# Patient Record
Sex: Male | Born: 1940 | Race: White | Hispanic: No | Marital: Married | Smoking: Former smoker
Health system: Southern US, Community
[De-identification: ages and names within clinical notes are randomized; demographics above are authoritative.]

## PROBLEM LIST (undated history)

## (undated) DIAGNOSIS — G709 Myoneural disorder, unspecified: Secondary | ICD-10-CM

## (undated) DIAGNOSIS — Z87442 Personal history of urinary calculi: Secondary | ICD-10-CM

## (undated) DIAGNOSIS — G4730 Sleep apnea, unspecified: Secondary | ICD-10-CM

## (undated) DIAGNOSIS — F32A Depression, unspecified: Secondary | ICD-10-CM

## (undated) DIAGNOSIS — M109 Gout, unspecified: Secondary | ICD-10-CM

## (undated) DIAGNOSIS — I10 Essential (primary) hypertension: Secondary | ICD-10-CM

## (undated) DIAGNOSIS — M1990 Unspecified osteoarthritis, unspecified site: Secondary | ICD-10-CM

## (undated) DIAGNOSIS — F419 Anxiety disorder, unspecified: Secondary | ICD-10-CM

## (undated) DIAGNOSIS — F329 Major depressive disorder, single episode, unspecified: Secondary | ICD-10-CM

## (undated) DIAGNOSIS — K219 Gastro-esophageal reflux disease without esophagitis: Secondary | ICD-10-CM

## (undated) DIAGNOSIS — C801 Malignant (primary) neoplasm, unspecified: Secondary | ICD-10-CM

## (undated) HISTORY — PX: OTHER SURGICAL HISTORY: SHX169

## (undated) HISTORY — PX: EYE SURGERY: SHX253

## (undated) HISTORY — PX: HIP ARTHROPLASTY: SHX981

## (undated) HISTORY — PX: HAMMER TOE SURGERY: SHX385

## (undated) HISTORY — PX: JOINT REPLACEMENT: SHX530

## (undated) HISTORY — PX: TRANSURETHRAL RESECTION OF PROSTATE: SHX73

## (undated) HISTORY — PX: HAND ARTHROTOMY: SHX969

## (undated) HISTORY — PX: CHOLECYSTECTOMY: SHX55

## (undated) HISTORY — PX: APPENDECTOMY: SHX54

---

## 1997-11-17 ENCOUNTER — Ambulatory Visit (HOSPITAL_COMMUNITY): Admission: RE | Admit: 1997-11-17 | Discharge: 1997-11-17 | Payer: Self-pay | Admitting: Internal Medicine

## 1997-12-07 ENCOUNTER — Ambulatory Visit (HOSPITAL_BASED_OUTPATIENT_CLINIC_OR_DEPARTMENT_OTHER): Admission: RE | Admit: 1997-12-07 | Discharge: 1997-12-07 | Payer: Self-pay | Admitting: Orthopedic Surgery

## 1997-12-20 ENCOUNTER — Other Ambulatory Visit: Admission: RE | Admit: 1997-12-20 | Discharge: 1997-12-20 | Payer: Self-pay | Admitting: Urology

## 1998-05-04 ENCOUNTER — Ambulatory Visit: Admission: RE | Admit: 1998-05-04 | Discharge: 1998-05-04 | Payer: Self-pay | Admitting: Otolaryngology

## 1998-05-11 ENCOUNTER — Ambulatory Visit (HOSPITAL_BASED_OUTPATIENT_CLINIC_OR_DEPARTMENT_OTHER): Admission: RE | Admit: 1998-05-11 | Discharge: 1998-05-11 | Payer: Self-pay | Admitting: Orthopedic Surgery

## 1998-06-04 HISTORY — PX: KNEE ARTHROPLASTY: SHX992

## 1998-09-26 ENCOUNTER — Ambulatory Visit (HOSPITAL_BASED_OUTPATIENT_CLINIC_OR_DEPARTMENT_OTHER): Admission: RE | Admit: 1998-09-26 | Discharge: 1998-09-26 | Payer: Self-pay | Admitting: Orthopedic Surgery

## 1998-12-09 ENCOUNTER — Encounter: Payer: Self-pay | Admitting: Orthopedic Surgery

## 1998-12-14 ENCOUNTER — Inpatient Hospital Stay (HOSPITAL_COMMUNITY): Admission: RE | Admit: 1998-12-14 | Discharge: 1998-12-19 | Payer: Self-pay | Admitting: Orthopedic Surgery

## 1998-12-19 ENCOUNTER — Inpatient Hospital Stay (HOSPITAL_COMMUNITY)
Admission: RE | Admit: 1998-12-19 | Discharge: 1998-12-24 | Payer: Self-pay | Admitting: Physical Medicine & Rehabilitation

## 2000-06-04 DIAGNOSIS — C801 Malignant (primary) neoplasm, unspecified: Secondary | ICD-10-CM

## 2000-06-04 HISTORY — DX: Malignant (primary) neoplasm, unspecified: C80.1

## 2001-06-04 HISTORY — PX: CATARACT EXTRACTION: SUR2

## 2001-10-31 ENCOUNTER — Encounter: Payer: Self-pay | Admitting: Internal Medicine

## 2001-10-31 ENCOUNTER — Encounter: Admission: RE | Admit: 2001-10-31 | Discharge: 2001-10-31 | Payer: Self-pay | Admitting: Internal Medicine

## 2002-01-18 ENCOUNTER — Encounter: Payer: Self-pay | Admitting: Emergency Medicine

## 2002-01-18 ENCOUNTER — Emergency Department (HOSPITAL_COMMUNITY): Admission: EM | Admit: 2002-01-18 | Discharge: 2002-01-18 | Payer: Self-pay | Admitting: Emergency Medicine

## 2002-01-29 ENCOUNTER — Ambulatory Visit (HOSPITAL_BASED_OUTPATIENT_CLINIC_OR_DEPARTMENT_OTHER): Admission: RE | Admit: 2002-01-29 | Discharge: 2002-01-29 | Payer: Self-pay | Admitting: Urology

## 2002-01-29 ENCOUNTER — Encounter: Payer: Self-pay | Admitting: Urology

## 2002-02-17 ENCOUNTER — Encounter: Admission: RE | Admit: 2002-02-17 | Discharge: 2002-02-17 | Payer: Self-pay | Admitting: Urology

## 2002-02-17 ENCOUNTER — Encounter: Payer: Self-pay | Admitting: Urology

## 2005-01-04 ENCOUNTER — Ambulatory Visit (HOSPITAL_COMMUNITY): Admission: RE | Admit: 2005-01-04 | Discharge: 2005-01-04 | Payer: Self-pay | Admitting: Internal Medicine

## 2005-01-11 ENCOUNTER — Ambulatory Visit (HOSPITAL_COMMUNITY): Admission: RE | Admit: 2005-01-11 | Discharge: 2005-01-11 | Payer: Self-pay | Admitting: Internal Medicine

## 2005-02-12 ENCOUNTER — Ambulatory Visit (HOSPITAL_COMMUNITY): Admission: RE | Admit: 2005-02-12 | Discharge: 2005-02-12 | Payer: Self-pay | Admitting: *Deleted

## 2005-02-12 ENCOUNTER — Encounter (INDEPENDENT_AMBULATORY_CARE_PROVIDER_SITE_OTHER): Payer: Self-pay | Admitting: Specialist

## 2005-02-14 ENCOUNTER — Ambulatory Visit (HOSPITAL_COMMUNITY): Admission: RE | Admit: 2005-02-14 | Discharge: 2005-02-17 | Payer: Self-pay | Admitting: General Surgery

## 2005-02-14 ENCOUNTER — Encounter (INDEPENDENT_AMBULATORY_CARE_PROVIDER_SITE_OTHER): Payer: Self-pay | Admitting: Specialist

## 2007-03-19 ENCOUNTER — Ambulatory Visit (HOSPITAL_COMMUNITY): Admission: RE | Admit: 2007-03-19 | Discharge: 2007-03-19 | Payer: Self-pay | Admitting: *Deleted

## 2008-03-23 ENCOUNTER — Emergency Department (HOSPITAL_COMMUNITY): Admission: EM | Admit: 2008-03-23 | Discharge: 2008-03-23 | Payer: Self-pay | Admitting: Emergency Medicine

## 2008-08-16 ENCOUNTER — Encounter: Admission: RE | Admit: 2008-08-16 | Discharge: 2008-08-16 | Payer: Self-pay | Admitting: Internal Medicine

## 2010-05-22 ENCOUNTER — Inpatient Hospital Stay (HOSPITAL_COMMUNITY)
Admission: RE | Admit: 2010-05-22 | Discharge: 2010-05-25 | Payer: Self-pay | Source: Home / Self Care | Attending: Orthopedic Surgery | Admitting: Orthopedic Surgery

## 2010-08-14 LAB — URINALYSIS, ROUTINE W REFLEX MICROSCOPIC
Glucose, UA: NEGATIVE mg/dL
Specific Gravity, Urine: 1.021 (ref 1.005–1.030)
pH: 5 (ref 5.0–8.0)

## 2010-08-14 LAB — CBC
HCT: 31.6 % — ABNORMAL LOW (ref 39.0–52.0)
MCH: 30.5 pg (ref 26.0–34.0)
MCHC: 33.5 g/dL (ref 30.0–36.0)
MCHC: 33.8 g/dL (ref 30.0–36.0)
MCHC: 33.8 g/dL (ref 30.0–36.0)
MCV: 90.5 fL (ref 78.0–100.0)
MCV: 90.6 fL (ref 78.0–100.0)
MCV: 90.8 fL (ref 78.0–100.0)
Platelets: 112 10*3/uL — ABNORMAL LOW (ref 150–400)
Platelets: 122 10*3/uL — ABNORMAL LOW (ref 150–400)
Platelets: 146 10*3/uL — ABNORMAL LOW (ref 150–400)
RDW: 12.5 % (ref 11.5–15.5)
RDW: 12.6 % (ref 11.5–15.5)
RDW: 12.8 % (ref 11.5–15.5)
WBC: 14.8 10*3/uL — ABNORMAL HIGH (ref 4.0–10.5)
WBC: 8.1 10*3/uL (ref 4.0–10.5)
WBC: 9.3 10*3/uL (ref 4.0–10.5)

## 2010-08-14 LAB — BASIC METABOLIC PANEL
BUN: 15 mg/dL (ref 6–23)
Creatinine, Ser: 1.03 mg/dL (ref 0.4–1.5)
GFR calc non Af Amer: >60 mL/min (ref >60)
Glucose, Bld: 156 mg/dL — ABNORMAL HIGH (ref 70–99)

## 2010-08-14 LAB — PROTIME-INR: INR: 1.03 (ref 0.00–1.49)

## 2010-08-14 LAB — URINE MICROSCOPIC-ADD ON

## 2010-08-15 LAB — BASIC METABOLIC PANEL
BUN: 29 mg/dL — ABNORMAL HIGH (ref 6–23)
CO2: 25 mEq/L (ref 19–32)
Calcium: 9.9 mg/dL (ref 8.4–10.5)
Chloride: 101 mEq/L (ref 96–112)
Creatinine, Ser: 1.21 mg/dL (ref 0.4–1.5)
GFR calc Af Amer: >60 mL/min (ref >60)
Glucose, Bld: 93 mg/dL (ref 70–99)

## 2010-08-15 LAB — CBC
MCH: 31.4 pg (ref 26.0–34.0)
MCHC: 34.9 g/dL (ref 30.0–36.0)
MCV: 89.9 fL (ref 78.0–100.0)
Platelets: 137 10*3/uL — ABNORMAL LOW (ref 150–400)
RDW: 12.6 % (ref 11.5–15.5)

## 2010-08-15 LAB — PROTIME-INR: Prothrombin Time: 12.9 seconds (ref 11.6–15.2)

## 2010-08-15 LAB — ABO/RH: ABO/RH(D): A POS

## 2010-08-15 LAB — TYPE AND SCREEN

## 2010-10-17 NOTE — Op Note (Signed)
NAME:  GER, RINGENBERG NO.:  [id number]   MEDICAL RECORD NO.:  [id number]          PATIENT TYPE:  AMB   LOCATION:  ENDO                         FACILITY:  [facility]   PHYSICIAN:  Georgiana Spinner, M.D.    DATE OF BIRTH:  [birth year]   DATE OF PROCEDURE:  DATE OF DISCHARGE:                               OPERATIVE REPORT   PROCEDURE:  Colonoscopy.   INDICATIONS:  Colon polyps.   ANESTHESIA:  Fentanyl 100 mcg, Versed 10 mg.   PROCEDURE IN DETAIL:  With the patient mildly sedated in the left  lateral decubitus position, a rectal exam was performed which was  unremarkable on examination.  Subsequently, the Pentax videoscopic  colonoscope was inserted into the rectum and passed under direct vision  through a diverticula-filled sigmoid colon to reach the cecum,  identified by ileocecal valve and appendiceal orifice, both of which  were photographed.  From this point, colonoscope was slowly withdrawn  taking circumferential views of colonic mucosa stopping only then in the  rectum which appeared normal and showed hemorrhoidal tissue on  retroflexed view.  The endoscope was straightened and withdrawn.  The  patient's vital signs and pulse oximeter remained stable.  The patient  tolerated the procedure well and left without apparent complication.   FINDINGS:  1. Diverticulosis of sigmoid colon.  2. Internal hemorrhoids, otherwise unremarkable exam.   PLAN:  Repeat examination of 5 years.           ______________________________  Georgiana Spinner, M.D.     GMO/MEDQ  D:  03/19/2007  T:  03/19/2007  Job:  [id number]

## 2010-10-20 NOTE — Op Note (Signed)
NAME:  Austin Webb, Austin Webb               ACCOUNT NO.:  [id number]   MEDICAL RECORD NO.:  [id number]          PATIENT TYPE:  AMB   LOCATION:  ENDO                         FACILITY:  [facility]   PHYSICIAN:  Georgiana Spinner, M.D.    DATE OF BIRTH:  08/07/1940   DATE OF PROCEDURE:  02/12/2005  DATE OF DISCHARGE:                                 OPERATIVE REPORT   PROCEDURE:  Upper endoscopy.   INDICATIONS FOR PROCEDURE:  Gastroesophageal reflux disease symptoms.   ANESTHESIA:  Demerol 50, Versed 5 mg.   DESCRIPTION OF PROCEDURE:  With the patient mildly sedated in the left  lateral decubitus position, the Olympus videoscopic endoscope was inserted  in the mouth and passed under direct vision through the esophagus which  appeared normal into the stomach. The fundus, body, antrum, duodenal bulb  and second portion of the duodenum were visualized. From this point, the  endoscope was slowly withdrawn taking circumferential views of the duodenal  mucosa until the endoscope had been pulled back into the stomach, placed in  retroflexion to view the stomach from below. The endoscope was straightened  and withdrawn taking circumferential views of the remaining gastric and  esophageal mucosa stopping in the fundus of the stomach where some  erythematous changes were seen on the gastric folds. They were photographed  and biopsied. The patient's vital signs and pulse oximeter remained stable.  The patient tolerated the procedure well without apparent complications.   FINDINGS:  Minimal erythema of gastric folds biopsied, await biopsy report.  The patient will call me for results and followup with me as an outpatient.  Proceed to colonoscopy as planned.           ______________________________  Georgiana Spinner, M.D.     GMO/MEDQ  D:  02/12/2005  T:  02/12/2005  Job:  [id number]   cc:   Anselm Pancoast. Zachery Dakins, M.D.  [address]  [place]  [place] [address]

## 2010-10-20 NOTE — Op Note (Signed)
NAME:  Austin Webb, Austin Webb               ACCOUNT NO.:  [id number]   MEDICAL RECORD NO.:  [id number]          PATIENT TYPE:  AMB   LOCATION:  ENDO                         FACILITY:  [facility]   PHYSICIAN:  Georgiana Spinner, M.D.    DATE OF BIRTH:  1941/04/11   DATE OF PROCEDURE:  02/12/2005  DATE OF DISCHARGE:                                 OPERATIVE REPORT   PROCEDURE:  Colonoscopy.   INDICATIONS FOR PROCEDURE:  Colon polyps.   ANESTHESIA:  Demerol 10, Versed 3 mg.   DESCRIPTION OF PROCEDURE:  With the patient mildly sedated in the left  lateral decubitus position, a rectal exam was performed which was  unremarkable. Subsequently, the Olympus videoscopic colonoscope was inserted  into the rectum and passed under direct vision through a diverticula filled  sigmoid colon to the cecum identified by the ileocecal valve and appendiceal  orifice both of which were photographed. From this point, the colonoscope  was slowly withdrawn taking circumferential views of the entire colonic  mucosa stopping first a few folds removed from the ileocecal valve where a  polyp was seen, photographed and removed using snare cautery technique on a  setting of 20/200 blended current. There was some residual tissue that was  touched with the hot biopsy forceps technique. We next stopped in the  hepatic flexure area where another polyp was seen and it too was removed  using hot biopsy forceps technique with the same setting. A third polyp was  seen in the sigmoid colon at approximately 20 cm from the anal verge and it  too was removed using hot biopsy forceps technique with the same setting.  The endoscope was then pulled to the rectum which appeared normal on direct  and showed small probably hyperplastic polyps in the retroflexed view and  these were eradicated with the hot biopsy forceps. The endoscope was  straightened and withdrawn, the patient's vital signs and pulse oximeter  remained stable. The patient  tolerated the procedure well without apparent  complications.   FINDINGS:  Polyps of rectum, sigmoid colon, hepatic flexure and ascending  colon all removed. Await biopsy reports. The patient will call me for  results and followup with me in as an outpatient. Additionally, fairly  significant diverticulosis of sigmoid colon.   PLAN:  See above note and endoscopy not for further evaluation.           ______________________________  Georgiana Spinner, M.D.     GMO/MEDQ  D:  02/12/2005  T:  02/12/2005  Job:  [id number]   cc:   Anselm Pancoast. Zachery Dakins, M.D.  [address]  [place]  [place] [address]

## 2010-10-20 NOTE — Op Note (Signed)
NAME:  Austin Webb, Austin Webb               ACCOUNT NO.:  [id number]   MEDICAL RECORD NO.:  [id number]          PATIENT TYPE:  OIB   LOCATION:  1621                         FACILITY:  [facility]   PHYSICIAN:  Anselm Pancoast. Weatherly, M.D.DATE OF BIRTH:  1941-04-03   DATE OF PROCEDURE:  02/14/2005  DATE OF DISCHARGE:                                 OPERATIVE REPORT   PREOPERATIVE DIAGNOSIS:  Chronic cholecystitis with stones.   POSTOPERATIVE DIAGNOSIS:  Chronic cholecystitis with stones and common duct  stones.   OPERATION:  Laparoscopic cholecystectomy with cholangiogram.   ANESTHESIA:  General.   SURGEON:  Anselm Pancoast. Zachery Dakins, M.D.   ASSISTANT:  Dr. Ripley Fraise.   ANESTHESIA:  General.   HISTORY:  Forney Kleinpeter is a 70 year old male who was referred to me by Dr.  Higinio Plan for episodes of epigastric pain.  The patient has a history of  fibromyalgia and other types of symptoms, including high blood pressure but  has recurring episodes of epigastric pain.  Dr. Virginia Rochester saw him and placed him  on Prilosec a few weeks ago.  He had no known history of gallstones, but  then an ultrasound was performed which showed numerous stones within his  gallbladder.  I saw him approximately two weeks ago, at which time he had  had watermelon a few days before, and he had episodes of severe epigastric  pain.  I called and talked with Dr. Virginia Rochester, since he had been scheduled for a  colonoscopy sometime in the near future and had that done on Monday  preoperatively.  The colonoscopy was unremarkable, and he is here today for  his planned procedure.  Preoperatively, his liver function studies were  basically normal.  The patient has PAS stockings.  He had been given 3 gm of  Unasyn and was taken to the operative suite.  Abdomen was first shaved  around the umbilicus and subxiphoid area, and this was prepped with Betadine  solution and draped in a sterile manner.  A small incision was made below  the umbilicus.  The  fascia identified.  Picked up between two Kochers after  a small opening and carefully entered into the peritoneal cavity.  The  patient has a fairly high liver.  The 10 mm subxiphoid trocar was made after  anesthetizing the fascia, and the two lateral 5 mm trocars were then made at  the appropriate position, a little higher than usual, by Dr. Charlsie Merles.  The  gallbladder was grasped and retracted upward and outward.  At the proximal  portion of the gallbladder, there were adhesions around it.  These were  carefully dissected free.  Then the proximal portion of the gallbladder was  identified.  I then separated this from the preperitoneal tissue.  Identifying the junction with the cystic duct and placed a clip right at  what I was the most distal portion of the cystic duct.  There was a little  bit of bile from the clip.  We then dissected a little bit more proximally  and noted that we had really placed the clip around the most proximal  portion of the gallbladder and not truly the cystic duct.  Then a small  opening was made.  A hook catheter was introduced and held in place with a  clip, and an x-ray was obtained.  The extrahepatic biliary system was  definitely dilated, and there appears to be three or possibly four stones at  the distal common bile duct.  We repeated an x-ray with the magnification  and then gave him 1 gm of Glucagon and repeated it.  On all of the x-rays,  we see the same findings, the bile does go around these into the duodenum,  but we are fairly confident there is definite common duct stones, and the  radiologist is in agreement.  The catheter was removed.  The more proximal  portion of the cystic duct was separated from the surrounding peritoneum and  fatty tissue and triply clipped, and this was divided distal to the clips.  The cystic artery and I think, really the anterior and posterior branch, and  it appears that the gallbladder was kind of on the mesentery.  These  were  both doubly clipped proximally, singly, and distally divided, and then the  gallbladder was freed from its bed with hook electrocautery.  I did place  some clips kind of on the mesentery for hemostasis and placed the  gallbladder in an EndoCatch bag.  The camera was switched to the upper 10 mm  port.  The gallbladder within the EndoCatch bag removed from the umbilicus.  I reinserted and irrigated, aspirated.  A good look at the proximal clip  area.  There was no evidence of bleeding or bile spillage, and all of the  bile had dripped out with the cholangiogram and had been aspirated. I then  placed an additional figure-of-eight suture of 0 Vicryl at the umbilicus,  tied both sutures, and then anesthetized the fascia at the umbilicus.  The 5  mm ports were withdrawn under direct vision.  Carbon dioxide released.  The  upper 10 mm trocar withdrawn.  The patient tolerated the procedure nicely,  and subcutaneous wounds were closed with 4-0 Vicryl.  We used 4-0 Monocryl  on a couple of them.  Then Benzoin and Steri-Strips on the skin.  Patient  will need an ERCP, and I will talk with Dr. Sabino Gasser about arrangements  for this probably tomorrow.           ______________________________  Anselm Pancoast. Zachery Dakins, M.D.     WJW/MEDQ  D:  02/14/2005  T:  02/14/2005  Job:  [id number]   cc:   Georgiana Spinner, M.D.  [address]  [place]  [place] [address]  Fax: [phone]   Janae Bridgeman. Eloise Harman., M.D.  [address]  [place]  [place] [address]  Fax: [phone]

## 2010-10-20 NOTE — Op Note (Signed)
NAME:  TRUSTEN, HUME NO.:  [id number]   MEDICAL RECORD NO.:  [id number]          PATIENT TYPE:  OIB   LOCATION:  1621                         FACILITY:  [facility]   PHYSICIAN:  Georgiana Spinner, M.D.    DATE OF BIRTH:  05/31/1941   DATE OF PROCEDURE:  DATE OF DISCHARGE:                                 OPERATIVE REPORT   No dictation for this job.           ______________________________  Georgiana Spinner, M.D.     GMO/MEDQ  D:  02/16/2005  T:  02/16/2005  Job:  [id number]

## 2011-04-12 ENCOUNTER — Other Ambulatory Visit: Payer: Self-pay | Admitting: Internal Medicine

## 2011-04-12 DIAGNOSIS — M542 Cervicalgia: Secondary | ICD-10-CM

## 2011-04-15 ENCOUNTER — Ambulatory Visit
Admission: RE | Admit: 2011-04-15 | Discharge: 2011-04-15 | Disposition: A | Discharge status: Home / Self Care | Payer: Medicare Other | Source: Ambulatory Visit | Attending: Internal Medicine | Admitting: Internal Medicine

## 2011-04-15 DIAGNOSIS — M542 Cervicalgia: Secondary | ICD-10-CM

## 2011-11-27 ENCOUNTER — Other Ambulatory Visit: Payer: Self-pay | Admitting: Dermatology

## 2012-05-06 ENCOUNTER — Other Ambulatory Visit: Payer: Self-pay | Admitting: Gastroenterology

## 2012-08-02 HISTORY — PX: BUNIONECTOMY: SHX129

## 2012-08-20 ENCOUNTER — Other Ambulatory Visit: Payer: Self-pay | Admitting: Pain Medicine

## 2012-08-20 DIAGNOSIS — M542 Cervicalgia: Secondary | ICD-10-CM

## 2012-08-24 ENCOUNTER — Other Ambulatory Visit: Payer: Medicare Other

## 2013-02-13 ENCOUNTER — Encounter (HOSPITAL_COMMUNITY): Payer: Self-pay | Admitting: Pharmacy Technician

## 2013-02-13 ENCOUNTER — Encounter (HOSPITAL_COMMUNITY): Payer: Self-pay | Admitting: *Deleted

## 2013-02-18 ENCOUNTER — Encounter (HOSPITAL_COMMUNITY): Payer: Self-pay | Admitting: Anesthesiology

## 2013-02-18 ENCOUNTER — Ambulatory Visit (HOSPITAL_COMMUNITY): Payer: Medicare Other | Admitting: Anesthesiology

## 2013-02-18 ENCOUNTER — Ambulatory Visit (HOSPITAL_COMMUNITY)
Admission: RE | Admit: 2013-02-18 | Discharge: 2013-02-18 | Disposition: A | Discharge status: Home / Self Care | Payer: Medicare Other | Source: Ambulatory Visit | Attending: Gastroenterology | Admitting: Gastroenterology

## 2013-02-18 ENCOUNTER — Ambulatory Visit (HOSPITAL_COMMUNITY): Payer: Medicare Other

## 2013-02-18 ENCOUNTER — Encounter (HOSPITAL_COMMUNITY): Payer: Self-pay | Admitting: *Deleted

## 2013-02-18 ENCOUNTER — Encounter (HOSPITAL_COMMUNITY)
Admission: RE | Disposition: A | Discharge status: Home / Self Care | Payer: Self-pay | Source: Ambulatory Visit | Attending: Gastroenterology

## 2013-02-18 DIAGNOSIS — I10 Essential (primary) hypertension: Secondary | ICD-10-CM | POA: Insufficient documentation

## 2013-02-18 DIAGNOSIS — K5710 Diverticulosis of small intestine without perforation or abscess without bleeding: Secondary | ICD-10-CM | POA: Insufficient documentation

## 2013-02-18 DIAGNOSIS — K219 Gastro-esophageal reflux disease without esophagitis: Secondary | ICD-10-CM | POA: Insufficient documentation

## 2013-02-18 DIAGNOSIS — K8050 Calculus of bile duct without cholangitis or cholecystitis without obstruction: Secondary | ICD-10-CM | POA: Insufficient documentation

## 2013-02-18 HISTORY — DX: Anxiety disorder, unspecified: F41.9

## 2013-02-18 HISTORY — DX: Myoneural disorder, unspecified: G70.9

## 2013-02-18 HISTORY — PX: ERCP: SHX5425

## 2013-02-18 HISTORY — DX: Sleep apnea, unspecified: G47.30

## 2013-02-18 HISTORY — DX: Unspecified osteoarthritis, unspecified site: M19.90

## 2013-02-18 HISTORY — PX: EUS: SHX5427

## 2013-02-18 HISTORY — DX: Malignant (primary) neoplasm, unspecified: C80.1

## 2013-02-18 HISTORY — DX: Gastro-esophageal reflux disease without esophagitis: K21.9

## 2013-02-18 HISTORY — DX: Essential (primary) hypertension: I10

## 2013-02-18 HISTORY — DX: Personal history of urinary calculi: Z87.442

## 2013-02-18 SURGERY — ESOPHAGEAL ENDOSCOPIC ULTRASOUND (EUS) RADIAL
Anesthesia: General

## 2013-02-18 MED ORDER — NEOSTIGMINE METHYLSULFATE 1 MG/ML IJ SOLN
INTRAMUSCULAR | Status: DC | PRN
  Administered 2013-02-18: 5 mg via INTRAVENOUS

## 2013-02-18 MED ORDER — LIDOCAINE HCL (CARDIAC) 20 MG/ML IV SOLN
INTRAVENOUS | Status: DC | PRN
  Administered 2013-02-18: 50 mg via INTRAVENOUS

## 2013-02-18 MED ORDER — PROMETHAZINE HCL 25 MG/ML IJ SOLN: 6.2500 mg | INTRAMUSCULAR | Status: DC | PRN

## 2013-02-18 MED ORDER — CIPROFLOXACIN IN D5W 400 MG/200ML IV SOLN
INTRAVENOUS | Status: AC
  Filled 2013-02-18: qty 200

## 2013-02-18 MED ORDER — LACTATED RINGERS IV SOLN
INTRAVENOUS | Status: DC
  Administered 2013-02-18 (×3): via INTRAVENOUS

## 2013-02-18 MED ORDER — PROPOFOL 10 MG/ML IV BOLUS
INTRAVENOUS | Status: DC | PRN
  Administered 2013-02-18: 200 mg via INTRAVENOUS

## 2013-02-18 MED ORDER — GLYCOPYRROLATE 0.2 MG/ML IJ SOLN
INTRAMUSCULAR | Status: DC | PRN
  Administered 2013-02-18: 0.6 mg via INTRAVENOUS
  Administered 2013-02-18: 0.2 mg via INTRAVENOUS

## 2013-02-18 MED ORDER — CIPROFLOXACIN IN D5W 400 MG/200ML IV SOLN
INTRAVENOUS | Status: DC | PRN
  Administered 2013-02-18: 400 mg via INTRAVENOUS

## 2013-02-18 MED ORDER — SODIUM CHLORIDE 0.9 % IV SOLN: INTRAVENOUS | Status: DC

## 2013-02-18 MED ORDER — SUCCINYLCHOLINE CHLORIDE 20 MG/ML IJ SOLN
INTRAMUSCULAR | Status: DC | PRN
  Administered 2013-02-18: 100 mg via INTRAVENOUS

## 2013-02-18 MED ORDER — ROCURONIUM BROMIDE 100 MG/10ML IV SOLN
INTRAVENOUS | Status: DC | PRN
  Administered 2013-02-18: 20 mg via INTRAVENOUS

## 2013-02-18 MED ORDER — FENTANYL CITRATE 0.05 MG/ML IJ SOLN
INTRAMUSCULAR | Status: DC | PRN
  Administered 2013-02-18: 50 ug via INTRAVENOUS

## 2013-02-18 MED ORDER — SODIUM CHLORIDE 0.9 % IV SOLN
INTRAVENOUS | Status: DC | PRN
  Administered 2013-02-18: 11:00:00

## 2013-02-18 MED ORDER — PHENYLEPHRINE HCL 10 MG/ML IJ SOLN
INTRAMUSCULAR | Status: DC | PRN
  Administered 2013-02-18 (×2): 40 ug via INTRAVENOUS
  Administered 2013-02-18: 80 ug via INTRAVENOUS

## 2013-02-18 MED ORDER — EPHEDRINE SULFATE 50 MG/ML IJ SOLN
INTRAMUSCULAR | Status: DC | PRN
  Administered 2013-02-18: 5 mg via INTRAVENOUS
  Administered 2013-02-18: 10 mg via INTRAVENOUS

## 2013-02-18 NOTE — Op Note (Signed)
[facility] [address] [place], [address]   ENDOSCOPIC ULTRASOUND PROCEDURE REPORT  PATIENT: Austin Webb, Austin Webb  MR#: [id number] BIRTHDATE: [birth year]  GENDER: Male ENDOSCOPIST: Willis Modena, MD REFERRED BY:  Pearson Grippe, M.D. PROCEDURE DATE:  02/18/2013 PROCEDURE:   Endoscopic ultrasound; ERCP with biliary sphincterotomy and bile duct stone extraction ASA CLASS:      Class II INDICATIONS:   intermittent abdominal pain, history bile duct stones. MEDICATIONS: General endotracheal anesthesia (GETA), Cipro 400 mg IV, and Cetacaine spray x 2  DESCRIPTION OF PROCEDURE:   After the risks benefits and alternatives of the procedure were  explained, informed consent was obtained. The patient was then placed in the left, lateral, decubitus postion and IV sedation was administered. Throughout the procedure, the patients blood pressure, pulse and oxygen saturations were monitored continuously.  Under direct visualization, the Pentax Radial EUS [id number]  endoscope was introduced through the mouth  and advanced to the second portion of the duodenum .  Water was used as necessary to provide an acoustic interface.  Upon completion of the imaging, water was removed and the patient was sent to the recovery room in satisfactory condition.    FINDINGS:      EUS:  Markedly J-shaped stomach.  8mm distal bile duct stone, just upstream from the ampulla, readily noted. ERCP:  Ampulla reached.  Evidence of prior biliary sphincterotomy seen.  Small periampullary diverticulum.  Deep biliary access readily achieved.  Cholangiogram confirmed distal bile duct filling defect.  Bile duct about 8mm in diameter.  Biliary sphincterotomy was extended with blended current without immediate complication; bile duct stone was extracted with trapezoidal basket. Post-occlusion cholangiogram showed no residual bile duct stones. Pancreatogram was not obtained, intentionally.  IMPRESSION:     As  above.  Suspect choledocholithiasis is cause of patient's intermittent abdominal pain.  Bile duct stone extracted as described above.  RECOMMENDATIONS:     1.  Watch for potential complications of procedure. 2.  Avoid ASA/NSAIDs for 5 days post-papillotomy, please. 3.  Follow clinical response to sphincterotomy and stone extraction. 4.  Follow-up with [facility] in 6-8 weeks.   _______________________________ Rosalie DoctorWillis Modena, MD 02/18/2013 11:01 AM   CC:

## 2013-02-18 NOTE — Progress Notes (Signed)
Additional note since procedure.  Patient was positioned for general surgery and pillow and pads in proper position to avoid pressure points that might impair circulation

## 2013-02-18 NOTE — Transfer of Care (Signed)
Immediate Anesthesia Transfer of Care Note  Patient: Austin Webb  Procedure(s) Performed: Procedure(s): ESOPHAGEAL ENDOSCOPIC ULTRASOUND (EUS) RADIAL (N/A) ENDOSCOPIC RETROGRADE CHOLANGIOPANCREATOGRAPHY (ERCP) (N/A)  Patient Location: PACU and Endoscopy Unit  Anesthesia Type:General  Level of Consciousness: awake and alert   Airway & Oxygen Therapy: Patient Spontanous Breathing and Patient connected to face mask oxygen  Post-op Assessment: Report given to PACU RN and Post -op Vital signs reviewed and stable  Post vital signs: Reviewed and stable  Complications: No apparent anesthesia complications

## 2013-02-18 NOTE — H&P (Signed)
Patient interval history reviewed.  Patient examined again.  There has been no change from documented H/P dated 02/12/13 (scanned into chart from our office) except as documented above.  Assessment:  1.  Intermittent upper abdominal pain.  Concern for choledocholithiasis.  Plan:  1.  Endoscopic ultrasound, with ERCP to follow if choledocholithiasis is seen. 2.  Risks (bleeding, infection, bowel perforation that could require surgery, sedation-related changes in cardiopulmonary systems), benefits (identification and possible treatment of source of symptoms, exclusion of certain causes of symptoms), and alternatives (watchful waiting, radiographic imaging studies, empiric medical treatment) of upper endoscopy with ultrasound (EUS) were explained to patient in detail and patient wishes to proceed. 3.  Risks (up to and including bleeding, infection, perforation, pancreatitis that can be complicated by infected necrosis and death), benefits (removal of stones, alleviating blockage, decreasing risk of cholangitis or choledocholithiasis-related pancreatitis), and alternatives (watchful waiting, percutaneous transhepatic cholangiography) of ERCP were explained to patient in detail and patient elects to proceed.

## 2013-02-18 NOTE — Anesthesia Preprocedure Evaluation (Addendum)
Anesthesia Evaluation  Patient identified by MRN, date of birth, ID band Patient awake    Reviewed: Allergy & Precautions, H&P , NPO status , Patient's Chart, lab work & pertinent test results  Airway Mallampati: II TM Distance: >3 FB Neck ROM: Full    Dental  (+) Upper Dentures, Lower Dentures and Dental Advisory Given   Pulmonary neg pulmonary ROS, sleep apnea , former smoker,  breath sounds clear to auscultation  Pulmonary exam normal       Cardiovascular hypertension, Pt. on medications Rhythm:Regular Rate:Normal     Neuro/Psych Anxiety  Neuromuscular disease negative psych ROS   GI/Hepatic Neg liver ROS, GERD-  ,  Endo/Other  negative endocrine ROS  Renal/GU negative Renal ROS  negative genitourinary   Musculoskeletal  (+) Arthritis -, Osteoarthritis,    Abdominal   Peds  Hematology negative hematology ROS (+)   Anesthesia Other Findings X4 dental post for lower denture; discussed leaving lower dental plate in for endoscopic procedure, to protect post and prevent further damage occuring to upper gum.  Reproductive/Obstetrics                          Anesthesia Physical Anesthesia Plan  ASA: II  Anesthesia Plan: General   Post-op Pain Management:    Induction: Intravenous  Airway Management Planned: Oral ETT  Additional Equipment:   Intra-op Plan:   Post-operative Plan: Extubation in OR  Informed Consent: I have reviewed the patients History and Physical, chart, labs and discussed the procedure including the risks, benefits and alternatives for the proposed anesthesia with the patient or authorized representative who has indicated his/her understanding and acceptance.   Dental advisory given  Plan Discussed with: CRNA  Anesthesia Plan Comments:         Anesthesia Quick Evaluation

## 2013-02-18 NOTE — Anesthesia Postprocedure Evaluation (Signed)
Anesthesia Post Note  Patient: Austin Webb  Procedure(s) Performed: Procedure(s) (LRB): ESOPHAGEAL ENDOSCOPIC ULTRASOUND (EUS) RADIAL (N/A) ENDOSCOPIC RETROGRADE CHOLANGIOPANCREATOGRAPHY (ERCP) (N/A)  Anesthesia type: General  Patient location: PACU  Post pain: Pain level controlled  Post assessment: Post-op Vital signs reviewed  Last Vitals:  Filed Vitals:   02/18/13 1140  BP: 148/97  Pulse:   Temp:   Resp: 15    Post vital signs: Reviewed  Level of consciousness: sedated  Complications: No apparent anesthesia complications

## 2013-02-19 ENCOUNTER — Encounter (HOSPITAL_COMMUNITY): Payer: Self-pay | Admitting: Gastroenterology

## 2013-05-16 ENCOUNTER — Other Ambulatory Visit: Payer: Medicare Other

## 2013-10-11 ENCOUNTER — Emergency Department (HOSPITAL_COMMUNITY)
Admission: EM | Admit: 2013-10-11 | Discharge: 2013-10-11 | Disposition: A | Discharge status: Home / Self Care | Payer: Medicare Other | Attending: Emergency Medicine | Admitting: Emergency Medicine

## 2013-10-11 ENCOUNTER — Encounter (HOSPITAL_COMMUNITY): Payer: Self-pay | Admitting: Emergency Medicine

## 2013-10-11 DIAGNOSIS — Z85828 Personal history of other malignant neoplasm of skin: Secondary | ICD-10-CM | POA: Insufficient documentation

## 2013-10-11 DIAGNOSIS — I10 Essential (primary) hypertension: Secondary | ICD-10-CM | POA: Insufficient documentation

## 2013-10-11 DIAGNOSIS — Z87891 Personal history of nicotine dependence: Secondary | ICD-10-CM | POA: Insufficient documentation

## 2013-10-11 DIAGNOSIS — Z8669 Personal history of other diseases of the nervous system and sense organs: Secondary | ICD-10-CM | POA: Insufficient documentation

## 2013-10-11 DIAGNOSIS — F411 Generalized anxiety disorder: Secondary | ICD-10-CM | POA: Insufficient documentation

## 2013-10-11 DIAGNOSIS — M1710 Unilateral primary osteoarthritis, unspecified knee: Secondary | ICD-10-CM | POA: Insufficient documentation

## 2013-10-11 DIAGNOSIS — M169 Osteoarthritis of hip, unspecified: Secondary | ICD-10-CM | POA: Insufficient documentation

## 2013-10-11 DIAGNOSIS — Z87442 Personal history of urinary calculi: Secondary | ICD-10-CM | POA: Insufficient documentation

## 2013-10-11 DIAGNOSIS — Z79899 Other long term (current) drug therapy: Secondary | ICD-10-CM | POA: Insufficient documentation

## 2013-10-11 DIAGNOSIS — K219 Gastro-esophageal reflux disease without esophagitis: Secondary | ICD-10-CM | POA: Insufficient documentation

## 2013-10-11 DIAGNOSIS — E871 Hypo-osmolality and hyponatremia: Secondary | ICD-10-CM | POA: Insufficient documentation

## 2013-10-11 DIAGNOSIS — M1610 Unilateral primary osteoarthritis, unspecified hip: Secondary | ICD-10-CM | POA: Insufficient documentation

## 2013-10-11 DIAGNOSIS — R319 Hematuria, unspecified: Secondary | ICD-10-CM | POA: Insufficient documentation

## 2013-10-11 DIAGNOSIS — IMO0002 Reserved for concepts with insufficient information to code with codable children: Secondary | ICD-10-CM | POA: Insufficient documentation

## 2013-10-11 DIAGNOSIS — N390 Urinary tract infection, site not specified: Secondary | ICD-10-CM | POA: Insufficient documentation

## 2013-10-11 LAB — CBC WITH DIFFERENTIAL/PLATELET
BASOS ABS: 0 10*3/uL (ref 0.0–0.1)
BASOS PCT: 0 % (ref 0–1)
EOS ABS: 0.2 10*3/uL (ref 0.0–0.7)
EOS PCT: 2 % (ref 0–5)
HEMATOCRIT: 34.8 % — AB (ref 39.0–52.0)
Hemoglobin: 12.5 g/dL — ABNORMAL LOW (ref 13.0–17.0)
Lymphocytes Relative: 19 % (ref 12–46)
Lymphs Abs: 2.4 10*3/uL (ref 0.7–4.0)
MCH: 30.7 pg (ref 26.0–34.0)
MCHC: 35.9 g/dL (ref 30.0–36.0)
MCV: 85.5 fL (ref 78.0–100.0)
MONO ABS: 0.7 10*3/uL (ref 0.1–1.0)
MONOS PCT: 6 % (ref 3–12)
NEUTROS ABS: 9.1 10*3/uL — AB (ref 1.7–7.7)
Neutrophils Relative %: 73 % (ref 43–77)
Platelets: 173 10*3/uL (ref 150–400)
RBC: 4.07 MIL/uL — ABNORMAL LOW (ref 4.22–5.81)
RDW: 12.5 % (ref 11.5–15.5)
WBC: 12.5 10*3/uL — ABNORMAL HIGH (ref 4.0–10.5)

## 2013-10-11 LAB — URINALYSIS, ROUTINE W REFLEX MICROSCOPIC
Glucose, UA: 100 mg/dL — AB
KETONES UR: 40 mg/dL — AB
NITRITE: POSITIVE — AB
SPECIFIC GRAVITY, URINE: 1.02 (ref 1.005–1.030)
UROBILINOGEN UA: 2 mg/dL — AB (ref 0.0–1.0)
pH: 5 (ref 5.0–8.0)

## 2013-10-11 LAB — URINE MICROSCOPIC-ADD ON

## 2013-10-11 LAB — BASIC METABOLIC PANEL
BUN: 14 mg/dL (ref 6–23)
CO2: 22 mEq/L (ref 19–32)
CREATININE: 0.93 mg/dL (ref 0.50–1.35)
Calcium: 8.3 mg/dL — ABNORMAL LOW (ref 8.4–10.5)
Chloride: 93 mEq/L — ABNORMAL LOW (ref 96–112)
GFR, EST NON AFRICAN AMERICAN: 81 mL/min — AB (ref >90)
GLUCOSE: 117 mg/dL — AB (ref 70–99)
Potassium: 3.8 mEq/L (ref 3.7–5.3)
Sodium: 126 mEq/L — ABNORMAL LOW (ref 137–147)

## 2013-10-11 MED ORDER — LIDOCAINE HCL 2 % EX GEL
Freq: Once | CUTANEOUS | Status: AC
  Administered 2013-10-11: 10 via URETHRAL
  Filled 2013-10-11: qty 10

## 2013-10-11 MED ORDER — CEPHALEXIN 500 MG PO CAPS: 500.0000 mg | ORAL_CAPSULE | Freq: Four times a day (QID) | ORAL | Status: DC

## 2013-10-11 MED ORDER — SODIUM CHLORIDE 0.9 % IV SOLN
INTRAVENOUS | Status: DC
  Administered 2013-10-11: 20 mL/h via INTRAVENOUS

## 2013-10-11 MED ORDER — DEXTROSE 5 % IV SOLN
1.0000 g | INTRAVENOUS | Status: DC
  Administered 2013-10-11: 1 g via INTRAVENOUS
  Filled 2013-10-11: qty 10

## 2013-10-11 NOTE — ED Notes (Signed)
Pt reports urinary urgency and it is difficult to urinate. Pt called the on call urologist and was told to come to ER if he had trouble urinating. Pt is expelling dark red clots and bright red blood.

## 2013-10-11 NOTE — ED Notes (Signed)
Bladder irrigation complete. Urine is light red and no clots present.

## 2013-10-11 NOTE — ED Notes (Signed)
Instructed pt how to change drainage bag to leg bag. Pt verbalizes understanding.

## 2013-10-11 NOTE — ED Provider Notes (Signed)
CSN: [id number]     Arrival date & time 10/11/13  0922 History   First MD Initiated Contact with Patient 10/11/13 1013     Chief Complaint  Patient presents with  . Post-op Problem  . Hematuria     (Consider location/radiation/quality/duration/timing/severity/associated sxs/prior Treatment) Patient is a 73 y.o. male presenting with hematuria. The history is provided by the patient.  Hematuria   patient here complaining of hematuria x24 hours. Patient had a TURP done 8 days ago and his Foley catheter removed 4 days ago and since that time his noted some suprapubic pressure which is been relieved when he urinates. Denies any fever or myalgias but does have a mild headache. He has noted blood clots mixed with urine. Denies any flank pain. No vomiting or diarrhea. Called his doctor and told to come here.  Past Medical History  Diagnosis Date  . Hypertension   . Anxiety   . Sleep apnea     no cpap used  . GERD (gastroesophageal reflux disease)   . History of kidney stones     x1 passed on own  . Neuromuscular disorder     restless leg syndrome  . Cancer     basal and squamous cell cancer lesion  . Arthritis     Osteoarthritis-left hip, s/p arthroplasty knees, right hip   Past Surgical History  Procedure Laterality Date  . Joint replacement    . Hip arthroplasty Right   . Knee arthroplasty Bilateral   . 4 titanium posts for denture placement    . Hand arthrotomy Right     thumb surgery  . Cholecystectomy      "stones"- Laparoscopic  . Hammer toe surgery Left     and bunionectomy  . Cataract extraction Left   . Eus N/A 02/18/2013    Procedure: ESOPHAGEAL ENDOSCOPIC ULTRASOUND (EUS) RADIAL;  Surgeon: Arta Silence, MD;  Location: [facility];  Service: Endoscopy;  Laterality: N/A;  . Ercp N/A 02/18/2013    Procedure: ENDOSCOPIC RETROGRADE CHOLANGIOPANCREATOGRAPHY (ERCP);  Surgeon: Arta Silence, MD;  Location: [facility];  Service: Endoscopy;  Laterality: N/A;  .  Transurethral resection of prostate     History reviewed. No pertinent family history. History  Substance Use Topics  . Smoking status: Former Smoker -- 1.00 packs/day for 12 years    Quit date: 06/04/1982  . Smokeless tobacco: Not on file  . Alcohol Use: Yes     Comment: rare     Review of Systems  Genitourinary: Positive for hematuria.  All other systems reviewed and are negative.     Allergies  Arthrotec; Benazepril hcl; Doxazosin; and Meloxicam  Home Medications   Prior to Admission medications   Medication Sig Start Date End Date Taking? Authorizing Provider  amLODipine (NORVASC) 2.5 MG tablet Take 2.5 mg by mouth every morning.   Yes Historical Provider, MD  Carboxymethylcellulose Sodium 0.25 % SOLN Place 1-2 drops into both eyes daily as needed (For dry eyes).   Yes Historical Provider, MD  celecoxib (CELEBREX) 200 MG capsule Take 200 mg by mouth 2 (two) times daily as needed for mild pain.   Yes Historical Provider, MD  finasteride (PROSCAR) 5 MG tablet Take 5 mg by mouth daily.    Yes Historical Provider, MD  fish oil-omega-3 fatty acids 1000 MG capsule Take 1 g by mouth daily.   Yes Historical Provider, MD  Multiple Vitamin (MULTIVITAMIN WITH MINERALS) TABS tablet Take 1 tablet by mouth daily.   Yes Historical Provider, MD  omeprazole (PRILOSEC) 40 MG capsule Take 40 mg by mouth daily as needed (For acid reflex).    Yes Historical Provider, MD  oxyCODONE-acetaminophen (PERCOCET/ROXICET) 5-325 MG per tablet Take 0.5-1 tablets by mouth every 4 (four) hours as needed for severe pain.   Yes Historical Provider, MD  PRESCRIPTION MEDICATION Apply 1-2 g topically 2 (two) times daily as needed. Baclofen 2%, cyclobenzaprine 2%, diclofenac 3%, gabapentin 6%, lidocaine 3%   Yes Historical Provider, MD  senna (SENOKOT) 8.6 MG TABS tablet Take 1 tablet by mouth daily as needed for mild constipation.   Yes Historical Provider, MD  sertraline (ZOLOFT) 100 MG tablet Take 100 mg by mouth  daily.    Yes Historical Provider, MD  tadalafil (CIALIS) 5 MG tablet Take 5 mg by mouth daily as needed for erectile dysfunction.   Yes Historical Provider, MD  tamsulosin (FLOMAX) 0.4 MG CAPS capsule Take 0.4 mg by mouth daily.   Yes Historical Provider, MD   BP 132/78  Pulse 78  Temp(Src) 100.1 F (37.8 C) (Oral)  Resp 20  SpO2 96% Physical Exam  Nursing note and vitals reviewed. Constitutional: He is oriented to person, place, and time. He appears well-developed and well-nourished.  Non-toxic appearance. No distress.  HENT:  Head: Normocephalic and atraumatic.  Eyes: Conjunctivae, EOM and lids are normal. Pupils are equal, round, and reactive to light.  Neck: Normal range of motion. Neck supple. No tracheal deviation present. No mass present.  Cardiovascular: Normal rate, regular rhythm and normal heart sounds.  Exam reveals no gallop.   No murmur heard. Pulmonary/Chest: Effort normal and breath sounds normal. No stridor. No respiratory distress. He has no decreased breath sounds. He has no wheezes. He has no rhonchi. He has no rales.  Abdominal: Soft. Normal appearance and bowel sounds are normal. He exhibits no distension. There is no tenderness. There is no rebound and no CVA tenderness.  Musculoskeletal: Normal range of motion. He exhibits no edema and no tenderness.  Neurological: He is alert and oriented to person, place, and time. He has normal strength. No cranial nerve deficit or sensory deficit. GCS eye subscore is 4. GCS verbal subscore is 5. GCS motor subscore is 6.  Skin: Skin is warm and dry. No abrasion and no rash noted.  Psychiatric: He has a normal mood and affect. His speech is normal and behavior is normal.    ED Course  Procedures (including critical care time) Labs Review Labs Reviewed  URINE CULTURE  CBC WITH DIFFERENTIAL  URINALYSIS, ROUTINE W REFLEX MICROSCOPIC  BASIC METABOLIC PANEL    Imaging Review No results found.   EKG Interpretation None       MDM   Final diagnoses:  None    Patient given dose of Rocephin and will be treated with Keflex. Patient also instructed to followup with his Dr. for his low sodium. Foley catheter replaced here and is now draining pink urine after his bladder was irrigated with saline. He is to followup with his urologist for this    Leota Jacobsen, MD 10/11/13 1341

## 2013-10-11 NOTE — ED Notes (Signed)
Patient tried to urinate but it wasn't enough to send off for a urine culture. Will try again later

## 2013-10-11 NOTE — ED Notes (Signed)
Per pt, had TURP  On May 1st.  Urine has remained dark with clots.  Cath removed on Tuesday.

## 2013-10-11 NOTE — ED Notes (Signed)
MD Allen at bedside.

## 2013-10-11 NOTE — Discharge Instructions (Signed)
Your sodium today was 126. Followup with Dr. Maudie Mercury for this. Call your urologist tomorrow to schedule followup about the blood in your urine. Also told him that you're being treated for a urinary tract infection Hyponatremia  Hyponatremia is when the amount of salt (sodium) in your blood is too low. When sodium levels are low, your cells will absorb extra water and swell. The swelling happens throughout the body, but it mostly affects the brain. Severe brain swelling (cerebral edema), seizures, or coma can happen.  CAUSES   Heart, kidney, or liver problems.  Thyroid problems.  Adrenal gland problems.  Severe vomiting and diarrhea.  Certain medicines or illegal drugs.  Dehydration.  Drinking too much water.  Low-sodium diet. SYMPTOMS   Nausea and vomiting.  Confusion.  Lethargy.  Agitation.  Headache.  Twitching or shaking (seizures).  Unconsciousness.  Appetite loss.  Muscle weakness and cramping. DIAGNOSIS  Hyponatremia is identified by a simple blood test. Your caregiver will perform a history and physical exam to try to find the cause and type of hyponatremia. Other tests may be needed to measure the amount of sodium in your blood and urine. TREATMENT  Treatment will depend on the cause.   Fluids may be given through the vein (IV).  Medicines may be used to correct the sodium imbalance. If medicines are causing the problem, they will need to be adjusted.  Water or fluid intake may be restricted to restore proper balance. The speed of correcting the sodium problem is very important. If the problem is corrected too fast, nerve damage (sometimes unchangeable) can happen. HOME CARE INSTRUCTIONS   Only take medicines as directed by your caregiver. Many medicines can make hyponatremia worse. Discuss all your medicines with your caregiver.  Carefully follow any recommended diet, including any fluid restrictions.  You may be asked to repeat lab tests. Follow these  directions.  Avoid alcohol and recreational drugs. SEEK MEDICAL CARE IF:   You develop worsening nausea, fatigue, headache, confusion, or weakness.  Your original hyponatremia symptoms return.  You have problems following the recommended diet. SEEK IMMEDIATE MEDICAL CARE IF:   You have a seizure.  You faint.  You have ongoing diarrhea or vomiting. MAKE SURE YOU:   Understand these instructions.  Will watch your condition.  Will get help right away if you are not doing well or get worse. Document Released: 05/11/2002 Document Revised: 08/13/2011 Document Reviewed: 11/05/2010 [facility] Patient Information 2014 [place], [place]. Foley Catheter Care, Adult A Foley catheter is a soft, flexible tube that is placed into the bladder to drain urine. A Foley catheter may be inserted if:  You leak urine or are not able to control when you urinate (urinary incontinence).  You are not able to urinate when you need to (urinary retention).  You had prostate surgery or surgery on the genitals.  You have certain medical conditions, such as multiple sclerosis, dementia, or a spinal cord injury. If you are going home with a Foley catheter in place, follow the instructions below. TAKING CARE OF THE CATHETER 1. Wash your hands with soap and water. 2. Using mild soap and warm water on a clean washcloth:  Clean the area on your body closest to the catheter insertion site using a circular motion, moving away from the catheter. Never wipe toward the catheter because this could sweep bacteria up into the urethra and cause infection.  Remove all traces of soap. Pat the area dry with a clean towel. For males, reposition the foreskin.  3. Attach the catheter to your leg so there is no tension on the catheter. Use adhesive tape or a leg strap. If you are using adhesive tape, remove any sticky residue left behind by the previous tape you used. 4. Keep the drainage bag below the level of the bladder, but  keep it off the floor. 5. Check throughout the day to be sure the catheter is working and urine is draining freely. Make sure the tubing does not become kinked. 6. Do not pull on the catheter or try to remove it. Pulling could damage internal tissues. TAKING CARE OF THE DRAINAGE BAGS You will be given two drainage bags to take home. One is a large overnight drainage bag, and the other is a smaller leg bag that fits underneath clothing. You may wear the overnight bag at any time, but you should never wear the smaller leg bag at night. Follow the instructions below for how to empty, change, and clean your drainage bags. Emptying the Drainage Bag You must empty your drainage bag when it is    full or at least 2 3 times a day. 1. Wash your hands with soap and water. 2. Keep the drainage bag below your hips, below the level of your bladder. This stops urine from going back into the tubing and into your bladder. 3. Hold the dirty bag over the toilet or a clean container. 4. Open the pour spout at the bottom of the bag and empty the urine into the toilet or container. Do not let the pour spout touch the toilet, container, or any other surface. Doing so can place bacteria on the bag, which can cause an infection. 5. Clean the pour spout with a gauze pad or cotton ball that has rubbing alcohol on it. 6. Close the pour spout. 7. Attach the bag to your leg with adhesive tape or a leg strap. 8. Wash your hands well. Changing the Drainage Bag Change your drainage bag once a month or sooner if it starts to smell bad or look dirty. Below are steps to follow when changing the drainage bag. 1. Wash your hands with soap and water. 2. Pinch off the rubber catheter so that urine does not spill out. 3. Disconnect the catheter tube from the drainage tube at the connection valve. Do not let the tubes touch any surface. 4. Clean the end of the catheter tube with an alcohol wipe. Use a different alcohol wipe to clean  the end of the drainage tube. 5. Connect the catheter tube to the drainage tube of the clean drainage bag. 6. Attach the new bag to the leg with adhesive tape or a leg strap. Avoid attaching the new bag too tightly. 7. Wash your hands well. Cleaning the Drainage Bag 1. Wash your hands with soap and water. 2. Wash the bag in warm, soapy water. 3. Rinse the bag thoroughly with warm water. 4. Fill the bag with a solution of white vinegar and water (1 cup vinegar to 1 qt warm water [.2 L vinegar to 1 L warm water]). Close the bag and soak it for 30 minutes in the solution. 5. Rinse the bag with warm water. 6. Hang the bag to dry with the pour spout open and hanging downward. 7. Store the clean bag (once it is dry) in a clean plastic bag. 8. Wash your hands well. PREVENTING INFECTION  Wash your hands before and after handling your catheter.  Take showers daily and wash the area where  the catheter enters your body. Do not take baths. Replace wet leg straps with dry ones, if this applies.  Do not use powders, sprays, or lotions on the genital area. Only use creams, lotions, or ointments as directed by your caregiver.  For females, wipe from front to back after each bowel movement.  Drink enough fluids to keep your urine clear or pale yellow unless you have a fluid restriction.  Do not let the drainage bag or tubing touch or lie on the floor.  Wear cotton underwear to absorb moisture and to keep your skin drier. SEEK MEDICAL CARE IF:   Your urine is cloudy or smells unusually bad.  Your catheter becomes clogged.  You are not draining urine into the bag or your bladder feels full.  Your catheter starts to leak. SEEK IMMEDIATE MEDICAL CARE IF:   You have pain, swelling, redness, or pus where the catheter enters the body.  You have pain in the abdomen, legs, lower back, or bladder.  You have a fever.  You see blood fill the catheter, or your urine is pink or red.  You have nausea,  vomiting, or chills.  Your catheter gets pulled out. MAKE SURE YOU:   Understand these instructions.  Will watch your condition.  Will get help right away if you are not doing well or get worse. Document Released: 05/21/2005 Document Revised: 09/15/2012 Document Reviewed: 05/12/2012 [facility] Patient Information 2014 [place]. Urinary Tract Infection Urinary tract infections (UTIs) can develop anywhere along your urinary tract. Your urinary tract is your body's drainage system for removing wastes and extra water. Your urinary tract includes two kidneys, two ureters, a bladder, and a urethra. Your kidneys are a pair of bean-shaped organs. Each kidney is about the size of your fist. They are located below your ribs, one on each side of your spine. CAUSES Infections are caused by microbes, which are microscopic organisms, including fungi, viruses, and bacteria. These organisms are so small that they can only be seen through a microscope. Bacteria are the microbes that most commonly cause UTIs. SYMPTOMS  Symptoms of UTIs may vary by age and gender of the patient and by the location of the infection. Symptoms in young women typically include a frequent and intense urge to urinate and a painful, burning feeling in the bladder or urethra during urination. Older women and men are more likely to be tired, shaky, and weak and have muscle aches and abdominal pain. A fever may mean the infection is in your kidneys. Other symptoms of a kidney infection include pain in your back or sides below the ribs, nausea, and vomiting. DIAGNOSIS To diagnose a UTI, your caregiver will ask you about your symptoms. Your caregiver also will ask to provide a urine sample. The urine sample will be tested for bacteria and white blood cells. White blood cells are made by your body to help fight infection. TREATMENT  Typically, UTIs can be treated with medication. Because most UTIs are caused by a bacterial infection, they  usually can be treated with the use of antibiotics. The choice of antibiotic and length of treatment depend on your symptoms and the type of bacteria causing your infection. HOME CARE INSTRUCTIONS  If you were prescribed antibiotics, take them exactly as your caregiver instructs you. Finish the medication even if you feel better after you have only taken some of the medication.  Drink enough water and fluids to keep your urine clear or pale yellow.  Avoid caffeine, tea, and  carbonated beverages. They tend to irritate your bladder.  Empty your bladder often. Avoid holding urine for long periods of time.  Empty your bladder before and after sexual intercourse.  After a bowel movement, women should cleanse from front to back. Use each tissue only once. SEEK MEDICAL CARE IF:   You have back pain.  You develop a fever.  Your symptoms do not begin to resolve within 3 days. SEEK IMMEDIATE MEDICAL CARE IF:   You have severe back pain or lower abdominal pain.  You develop chills.  You have nausea or vomiting.  You have continued burning or discomfort with urination. MAKE SURE YOU:   Understand these instructions.  Will watch your condition.  Will get help right away if you are not doing well or get worse. Document Released: 02/28/2005 Document Revised: 11/20/2011 Document Reviewed: 06/29/2011 [facility] Patient Information 2014 [place].

## 2013-10-12 ENCOUNTER — Encounter (HOSPITAL_COMMUNITY): Payer: Self-pay | Admitting: Emergency Medicine

## 2013-10-12 ENCOUNTER — Inpatient Hospital Stay (HOSPITAL_COMMUNITY)
Admission: EM | Admit: 2013-10-12 | Discharge: 2013-10-13 | DRG: 699 | Disposition: A | Discharge status: Home / Self Care | Payer: Medicare Other | Attending: Internal Medicine | Admitting: Internal Medicine

## 2013-10-12 DIAGNOSIS — T8389XA Other specified complication of genitourinary prosthetic devices, implants and grafts, initial encounter: Principal | ICD-10-CM | POA: Diagnosis present

## 2013-10-12 DIAGNOSIS — I10 Essential (primary) hypertension: Secondary | ICD-10-CM | POA: Diagnosis present

## 2013-10-12 DIAGNOSIS — G4730 Sleep apnea, unspecified: Secondary | ICD-10-CM | POA: Diagnosis present

## 2013-10-12 DIAGNOSIS — K219 Gastro-esophageal reflux disease without esophagitis: Secondary | ICD-10-CM | POA: Diagnosis present

## 2013-10-12 DIAGNOSIS — Z87442 Personal history of urinary calculi: Secondary | ICD-10-CM

## 2013-10-12 DIAGNOSIS — Y846 Urinary catheterization as the cause of abnormal reaction of the patient, or of later complication, without mention of misadventure at the time of the procedure: Secondary | ICD-10-CM | POA: Diagnosis present

## 2013-10-12 DIAGNOSIS — G2581 Restless legs syndrome: Secondary | ICD-10-CM | POA: Diagnosis present

## 2013-10-12 DIAGNOSIS — R319 Hematuria, unspecified: Secondary | ICD-10-CM | POA: Diagnosis present

## 2013-10-12 DIAGNOSIS — D62 Acute posthemorrhagic anemia: Secondary | ICD-10-CM | POA: Diagnosis present

## 2013-10-12 DIAGNOSIS — D500 Iron deficiency anemia secondary to blood loss (chronic): Secondary | ICD-10-CM | POA: Diagnosis present

## 2013-10-12 DIAGNOSIS — Z87891 Personal history of nicotine dependence: Secondary | ICD-10-CM

## 2013-10-12 DIAGNOSIS — M1990 Unspecified osteoarthritis, unspecified site: Secondary | ICD-10-CM | POA: Diagnosis present

## 2013-10-12 DIAGNOSIS — D649 Anemia, unspecified: Secondary | ICD-10-CM | POA: Diagnosis present

## 2013-10-12 DIAGNOSIS — Z9079 Acquired absence of other genital organ(s): Secondary | ICD-10-CM

## 2013-10-12 DIAGNOSIS — N320 Bladder-neck obstruction: Secondary | ICD-10-CM | POA: Diagnosis present

## 2013-10-12 DIAGNOSIS — T83091A Other mechanical complication of indwelling urethral catheter, initial encounter: Secondary | ICD-10-CM

## 2013-10-12 DIAGNOSIS — Z96649 Presence of unspecified artificial hip joint: Secondary | ICD-10-CM

## 2013-10-12 DIAGNOSIS — R339 Retention of urine, unspecified: Secondary | ICD-10-CM | POA: Diagnosis present

## 2013-10-12 DIAGNOSIS — Z79899 Other long term (current) drug therapy: Secondary | ICD-10-CM

## 2013-10-12 LAB — CBC WITH DIFFERENTIAL/PLATELET
Basophils Absolute: 0.1 10*3/uL (ref 0.0–0.1)
Basophils Relative: 1 % (ref 0–1)
EOS ABS: 0.4 10*3/uL (ref 0.0–0.7)
EOS PCT: 3 % (ref 0–5)
HCT: 31.3 % — ABNORMAL LOW (ref 39.0–52.0)
Hemoglobin: 11.2 g/dL — ABNORMAL LOW (ref 13.0–17.0)
Lymphocytes Relative: 28 % (ref 12–46)
Lymphs Abs: 3.2 10*3/uL (ref 0.7–4.0)
MCH: 31.1 pg (ref 26.0–34.0)
MCHC: 35.8 g/dL (ref 30.0–36.0)
MCV: 86.9 fL (ref 78.0–100.0)
Monocytes Absolute: 1 10*3/uL (ref 0.1–1.0)
Monocytes Relative: 9 % (ref 3–12)
Neutro Abs: 7 10*3/uL (ref 1.7–7.7)
Neutrophils Relative %: 60 % (ref 43–77)
PLATELETS: 169 10*3/uL (ref 150–400)
RBC: 3.6 MIL/uL — ABNORMAL LOW (ref 4.22–5.81)
RDW: 12.8 % (ref 11.5–15.5)
WBC: 11.6 10*3/uL — ABNORMAL HIGH (ref 4.0–10.5)

## 2013-10-12 LAB — I-STAT CHEM 8, ED
BUN: 10 mg/dL (ref 6–23)
CREATININE: 1.2 mg/dL (ref 0.50–1.35)
Calcium, Ion: 1.16 mmol/L (ref 1.13–1.30)
Chloride: 98 mEq/L (ref 96–112)
Glucose, Bld: 105 mg/dL — ABNORMAL HIGH (ref 70–99)
HCT: 33 % — ABNORMAL LOW (ref 39.0–52.0)
HEMOGLOBIN: 11.2 g/dL — AB (ref 13.0–17.0)
Potassium: 4 mEq/L (ref 3.7–5.3)
SODIUM: 135 meq/L — AB (ref 137–147)
TCO2: 23 mmol/L (ref 0–100)

## 2013-10-12 LAB — URINE CULTURE
COLONY COUNT: NO GROWTH
CULTURE: NO GROWTH

## 2013-10-12 NOTE — ED Notes (Signed)
Pt was seen yesterday and had a catheter placed, today he saw blood clots and changed the bag, since then nothings draining in catheter and there is some leakage around his penis

## 2013-10-12 NOTE — ED Provider Notes (Signed)
CSN: [id number]     Arrival date & time 10/12/13  2149 History   First MD Initiated Contact with Patient 10/12/13 2202     Chief Complaint  Patient presents with  . Urinary Retention     (Consider location/radiation/quality/duration/timing/severity/associated sxs/prior Treatment) The history is provided by the patient.   patient had a TURP 9 days ago At [facility] by Dr. Amalia Hailey. He recently had had his Foley catheter moved. He then developed more hematuria and difficulty urinating. He was seen in the ER yesterday and had a Foley catheter placed. He has since developed more hematuria in his Foley has stopped up. He states his his urine and blood going around the catheter. Has had abdominal pain. His mild nausea. He states he was started on antibiotics. He states his urologist cannot see him for another week. No other bleeding. He states he started to feel a little lightheaded. Past Medical History  Diagnosis Date  . Hypertension   . Anxiety   . Sleep apnea     no cpap used  . GERD (gastroesophageal reflux disease)   . History of kidney stones     x1 passed on own  . Neuromuscular disorder     restless leg syndrome  . Cancer     basal and squamous cell cancer lesion  . Arthritis     Osteoarthritis-left hip, s/p arthroplasty knees, right hip   Past Surgical History  Procedure Laterality Date  . Joint replacement    . Hip arthroplasty Right   . Knee arthroplasty Bilateral   . 4 titanium posts for denture placement    . Hand arthrotomy Right     thumb surgery  . Cholecystectomy      "stones"- Laparoscopic  . Hammer toe surgery Left     and bunionectomy  . Cataract extraction Left   . Eus N/A 02/18/2013    Procedure: ESOPHAGEAL ENDOSCOPIC ULTRASOUND (EUS) RADIAL;  Surgeon: Arta Silence, MD;  Location: [facility];  Service: Endoscopy;  Laterality: N/A;  . Ercp N/A 02/18/2013    Procedure: ENDOSCOPIC RETROGRADE CHOLANGIOPANCREATOGRAPHY (ERCP);  Surgeon: Arta Silence, MD;   Location: [facility];  Service: Endoscopy;  Laterality: N/A;  . Transurethral resection of prostate     History reviewed. No pertinent family history. History  Substance Use Topics  . Smoking status: Former Smoker -- 1.00 packs/day for 12 years    Quit date: 06/04/1982  . Smokeless tobacco: Not on file  . Alcohol Use: Yes     Comment: rare     Review of Systems  Constitutional: Positive for fatigue. Negative for activity change and appetite change.  Eyes: Negative for pain.  Respiratory: Negative for chest tightness and shortness of breath.   Cardiovascular: Negative for chest pain and leg swelling.  Gastrointestinal: Negative for nausea, vomiting, abdominal pain and diarrhea.  Genitourinary: Positive for hematuria and decreased urine volume. Negative for flank pain and testicular pain.  Musculoskeletal: Negative for back pain and neck stiffness.  Skin: Negative for rash.  Neurological: Positive for light-headedness. Negative for weakness, numbness and headaches.  Psychiatric/Behavioral: Negative for behavioral problems.      Allergies  Arthrotec; Benazepril hcl; Doxazosin; and Meloxicam  Home Medications   Prior to Admission medications   Medication Sig Start Date End Date Taking? Authorizing Provider  amLODipine (NORVASC) 2.5 MG tablet Take 2.5 mg by mouth every morning.    Historical Provider, MD  Carboxymethylcellulose Sodium 0.25 % SOLN Place 1-2 drops into both eyes daily as needed (For  dry eyes).    Historical Provider, MD  celecoxib (CELEBREX) 200 MG capsule Take 200 mg by mouth 2 (two) times daily as needed for mild pain.    Historical Provider, MD  cephALEXin (KEFLEX) 500 MG capsule Take 1 capsule (500 mg total) by mouth 4 (four) times daily. 10/11/13   Leota Jacobsen, MD  finasteride (PROSCAR) 5 MG tablet Take 5 mg by mouth daily.     Historical Provider, MD  fish oil-omega-3 fatty acids 1000 MG capsule Take 1 g by mouth daily.    Historical Provider, MD   Multiple Vitamin (MULTIVITAMIN WITH MINERALS) TABS tablet Take 1 tablet by mouth daily.    Historical Provider, MD  omeprazole (PRILOSEC) 40 MG capsule Take 40 mg by mouth daily as needed (For acid reflex).     Historical Provider, MD  oxyCODONE-acetaminophen (PERCOCET/ROXICET) 5-325 MG per tablet Take 0.5-1 tablets by mouth every 4 (four) hours as needed for severe pain.    Historical Provider, MD  PRESCRIPTION MEDICATION Apply 1-2 g topically 2 (two) times daily as needed. Baclofen 2%, cyclobenzaprine 2%, diclofenac 3%, gabapentin 6%, lidocaine 3%    Historical Provider, MD  senna (SENOKOT) 8.6 MG TABS tablet Take 1 tablet by mouth daily as needed for mild constipation.    Historical Provider, MD  sertraline (ZOLOFT) 100 MG tablet Take 100 mg by mouth daily.     Historical Provider, MD  tadalafil (CIALIS) 5 MG tablet Take 5 mg by mouth daily as needed for erectile dysfunction.    Historical Provider, MD  tamsulosin (FLOMAX) 0.4 MG CAPS capsule Take 0.4 mg by mouth daily.    Historical Provider, MD   BP 138/82  Pulse 72  Temp(Src) 97.6 F (36.4 C) (Oral)  Resp 18  SpO2 98% Physical Exam  Nursing note and vitals reviewed. Constitutional: He is oriented to person, place, and time. He appears well-developed and well-nourished.  HENT:  Head: Normocephalic and atraumatic.  Eyes: EOM are normal. Pupils are equal, round, and reactive to light.  Neck: Normal range of motion. Neck supple.  Cardiovascular: Normal rate, regular rhythm and normal heart sounds.   No murmur heard. Pulmonary/Chest: Effort normal and breath sounds normal.  Abdominal: Soft. Bowel sounds are normal. He exhibits mass. He exhibits no distension. There is tenderness. There is no rebound and no guarding.  Lower abdominal tenderness without rebound or guarding.  Genitourinary:  Foley catheter in place  Musculoskeletal: Normal range of motion. He exhibits no edema.  Neurological: He is alert and oriented to person, place,  and time. No cranial nerve deficit.  Skin: Skin is warm and dry.  Psychiatric: He has a normal mood and affect.    ED Course  Procedures (including critical care time) Labs Review Labs Reviewed  CBC WITH DIFFERENTIAL - Abnormal; Notable for the following:    WBC 11.6 (*)    RBC 3.60 (*)    Hemoglobin 11.2 (*)    HCT 31.3 (*)    All other components within normal limits  I-STAT CHEM 8, ED - Abnormal; Notable for the following:    Sodium 135 (*)    Glucose, Bld 105 (*)    Hemoglobin 11.2 (*)    HCT 33.0 (*)    All other components within normal limits  URINALYSIS, ROUTINE W REFLEX MICROSCOPIC    Imaging Review No results found.   EKG Interpretation None      MDM   Final diagnoses:  None    Patient with hematuria and obstructed  Foley catheter. TURP done at outside hospital. Hemoglobin is decreased 1 g in a day. Patient may need further irrigation and will likely need a hemoglobin rechecked in the morning to assess stability. Discussed with urology and patient will be seen in consult the hospitalist feels the need. Will admit to internal medicine    Jasper Riling. Alvino Chapel, MD 10/13/13 5320

## 2013-10-13 ENCOUNTER — Encounter (HOSPITAL_COMMUNITY): Payer: Self-pay | Admitting: Internal Medicine

## 2013-10-13 DIAGNOSIS — T8389XA Other specified complication of genitourinary prosthetic devices, implants and grafts, initial encounter: Principal | ICD-10-CM

## 2013-10-13 DIAGNOSIS — D649 Anemia, unspecified: Secondary | ICD-10-CM | POA: Diagnosis present

## 2013-10-13 DIAGNOSIS — Z9079 Acquired absence of other genital organ(s): Secondary | ICD-10-CM

## 2013-10-13 DIAGNOSIS — N320 Bladder-neck obstruction: Secondary | ICD-10-CM | POA: Diagnosis present

## 2013-10-13 DIAGNOSIS — R319 Hematuria, unspecified: Secondary | ICD-10-CM

## 2013-10-13 DIAGNOSIS — I10 Essential (primary) hypertension: Secondary | ICD-10-CM | POA: Diagnosis present

## 2013-10-13 LAB — BASIC METABOLIC PANEL
BUN: 10 mg/dL (ref 6–23)
BUN: 9 mg/dL (ref 6–23)
CALCIUM: 8.4 mg/dL (ref 8.4–10.5)
CHLORIDE: 98 meq/L (ref 96–112)
CO2: 23 meq/L (ref 19–32)
CO2: 26 meq/L (ref 19–32)
Calcium: 8.2 mg/dL — ABNORMAL LOW (ref 8.4–10.5)
Chloride: 102 mEq/L (ref 96–112)
Creatinine, Ser: 0.98 mg/dL (ref 0.50–1.35)
Creatinine, Ser: 0.96 mg/dL (ref 0.50–1.35)
GFR calc Af Amer: >90 mL/min (ref >90)
GFR calc Af Amer: >90 mL/min (ref >90)
GFR calc non Af Amer: 79 mL/min — ABNORMAL LOW (ref >90)
GFR, EST NON AFRICAN AMERICAN: 80 mL/min — AB (ref >90)
GLUCOSE: 100 mg/dL — AB (ref 70–99)
GLUCOSE: 121 mg/dL — AB (ref 70–99)
POTASSIUM: 3.5 meq/L — AB (ref 3.7–5.3)
Potassium: 3.6 mEq/L — ABNORMAL LOW (ref 3.7–5.3)
Sodium: 135 mEq/L — ABNORMAL LOW (ref 137–147)
Sodium: 139 mEq/L (ref 137–147)

## 2013-10-13 LAB — URINALYSIS, ROUTINE W REFLEX MICROSCOPIC
BILIRUBIN URINE: NEGATIVE
Glucose, UA: NEGATIVE mg/dL
KETONES UR: NEGATIVE mg/dL
Nitrite: NEGATIVE
PROTEIN: 30 mg/dL — AB
Specific Gravity, Urine: 1.003 — ABNORMAL LOW (ref 1.005–1.030)
UROBILINOGEN UA: 0.2 mg/dL (ref 0.0–1.0)
pH: 6 (ref 5.0–8.0)

## 2013-10-13 LAB — CBC
HCT: 30.1 % — ABNORMAL LOW (ref 39.0–52.0)
HEMOGLOBIN: 10.4 g/dL — AB (ref 13.0–17.0)
MCH: 30.3 pg (ref 26.0–34.0)
MCHC: 34.6 g/dL (ref 30.0–36.0)
MCV: 87.8 fL (ref 78.0–100.0)
Platelets: 144 10*3/uL — ABNORMAL LOW (ref 150–400)
RBC: 3.43 MIL/uL — AB (ref 4.22–5.81)
RDW: 13 % (ref 11.5–15.5)
WBC: 9.4 10*3/uL (ref 4.0–10.5)

## 2013-10-13 LAB — URINE MICROSCOPIC-ADD ON

## 2013-10-13 MED ORDER — HYDRALAZINE HCL 20 MG/ML IJ SOLN
10.0000 mg | INTRAMUSCULAR | Status: DC | PRN
  Filled 2013-10-13: qty 0.5

## 2013-10-13 MED ORDER — ACETAMINOPHEN 650 MG RE SUPP: 650.0000 mg | Freq: Four times a day (QID) | RECTAL | Status: DC | PRN

## 2013-10-13 MED ORDER — CIPROFLOXACIN IN D5W 400 MG/200ML IV SOLN
400.0000 mg | Freq: Two times a day (BID) | INTRAVENOUS | Status: DC
  Administered 2013-10-13 (×2): 400 mg via INTRAVENOUS
  Filled 2013-10-13 (×2): qty 200

## 2013-10-13 MED ORDER — MORPHINE SULFATE 2 MG/ML IJ SOLN
1.0000 mg | INTRAMUSCULAR | Status: DC | PRN
  Administered 2013-10-13: 1 mg via INTRAVENOUS
  Filled 2013-10-13: qty 1

## 2013-10-13 MED ORDER — ACETAMINOPHEN 325 MG PO TABS: 650.0000 mg | ORAL_TABLET | Freq: Four times a day (QID) | ORAL | Status: DC | PRN

## 2013-10-13 MED ORDER — ADULT MULTIVITAMIN W/MINERALS CH
1.0000 | ORAL_TABLET | Freq: Every day | ORAL | Status: DC
  Administered 2013-10-13: 1 via ORAL
  Filled 2013-10-13: qty 1

## 2013-10-13 MED ORDER — TAMSULOSIN HCL 0.4 MG PO CAPS
0.4000 mg | ORAL_CAPSULE | Freq: Every day | ORAL | Status: DC
  Administered 2013-10-13: 0.4 mg via ORAL
  Filled 2013-10-13: qty 1

## 2013-10-13 MED ORDER — OMEGA-3-ACID ETHYL ESTERS 1 G PO CAPS
1.0000 g | ORAL_CAPSULE | Freq: Every day | ORAL | Status: DC
  Administered 2013-10-13: 1 g via ORAL
  Filled 2013-10-13: qty 1

## 2013-10-13 MED ORDER — OXYCODONE-ACETAMINOPHEN 5-325 MG PO TABS
0.5000 | ORAL_TABLET | ORAL | Status: DC | PRN
  Administered 2013-10-13: 1 via ORAL
  Filled 2013-10-13: qty 1

## 2013-10-13 MED ORDER — ONDANSETRON HCL 4 MG/2ML IJ SOLN: 4.0000 mg | Freq: Four times a day (QID) | INTRAMUSCULAR | Status: DC | PRN

## 2013-10-13 MED ORDER — SODIUM CHLORIDE 0.9 % IV SOLN
INTRAVENOUS | Status: DC
  Administered 2013-10-13: 01:00:00 via INTRAVENOUS

## 2013-10-13 MED ORDER — POLYVINYL ALCOHOL 1.4 % OP SOLN: 1.0000 [drp] | Freq: Every day | OPHTHALMIC | Status: DC | PRN

## 2013-10-13 MED ORDER — PANTOPRAZOLE SODIUM 40 MG PO TBEC
40.0000 mg | DELAYED_RELEASE_TABLET | Freq: Every day | ORAL | Status: DC
  Administered 2013-10-13: 40 mg via ORAL
  Filled 2013-10-13: qty 1

## 2013-10-13 MED ORDER — AMLODIPINE BESYLATE 2.5 MG PO TABS
2.5000 mg | ORAL_TABLET | Freq: Every morning | ORAL | Status: DC
  Administered 2013-10-13: 2.5 mg via ORAL
  Filled 2013-10-13: qty 1

## 2013-10-13 MED ORDER — CELECOXIB 200 MG PO CAPS
200.0000 mg | ORAL_CAPSULE | Freq: Two times a day (BID) | ORAL | Status: DC | PRN
  Filled 2013-10-13: qty 1

## 2013-10-13 MED ORDER — SENNA 8.6 MG PO TABS: 1.0000 | ORAL_TABLET | Freq: Every day | ORAL | Status: DC | PRN

## 2013-10-13 MED ORDER — SERTRALINE HCL 100 MG PO TABS
100.0000 mg | ORAL_TABLET | Freq: Every day | ORAL | Status: DC
  Administered 2013-10-13: 100 mg via ORAL
  Filled 2013-10-13: qty 1

## 2013-10-13 MED ORDER — ONDANSETRON HCL 4 MG PO TABS: 4.0000 mg | ORAL_TABLET | Freq: Four times a day (QID) | ORAL | Status: DC | PRN

## 2013-10-13 MED ORDER — FINASTERIDE 5 MG PO TABS
5.0000 mg | ORAL_TABLET | Freq: Every day | ORAL | Status: DC
  Administered 2013-10-13: 5 mg via ORAL
  Filled 2013-10-13: qty 1

## 2013-10-13 NOTE — Progress Notes (Signed)
Pt discharged home; verbalized understanding discharge instructions and follow-up appts. Pt denied pain at the time of discharge. Pt was stable at discharge without any complaints.

## 2013-10-13 NOTE — ED Notes (Signed)
Report to Crystal RN-ready for pt

## 2013-10-13 NOTE — Discharge Summary (Signed)
Physician Discharge Summary  Austin Webb CHY:[id number] DOB: November 22, 1940 DOA: 10/12/2013  PCP: Jani Gravel, MD  Admit date: 10/12/2013 Discharge date: 10/13/2013  Time spent: 45 minutes  Recommendations for Outpatient Follow-up:  -Will be discharged home today. -Advised to follow up with his urologist at [facility] in 1 week.  Discharge Diagnoses:  Principal Problem:   Bladder outlet obstruction Active Problems:   HTN (hypertension)   Anemia   S/P TURP   Discharge Condition: Stable and improved  Filed Weights   10/13/13 0046  Weight: 94.2 kg (207 lb 10.8 oz)    History of present illness:  Austin Webb is a 73 y.o. male with history of hypertension who has had a recent TURP done at [facility] 10 days ago had come to the ER yesterday because patient was unable to void even and patient was sent home after Foley catheter was placed and was given Keflex antibiotics since patient's urine was showing possible UTI and was advised to follow with patient's urologist. Since morning patient states that he was having increasing hematuria with clots and his Foley catheter was getting blocked. He came back to the ER again and he had irrigation done but his Foley was getting blocked again and at this point Dr. Roni Bread, on-call urologist advised admission and close followup. Dr. Roni Bread will be following as consult. Patient otherwise denies any fever chills chest pain shortness of breath nausea vomiting or diarrhea.   Hospital Course:   Acute Urinary Retention 2/2 Obstruction from Hematuria -Given recent TURP. -Foley has been removed. -He has been voiding spontaneously. -Urine has become progressively clear. -Seen by Dr. Jeffie Pollock who agrees with DC home and follow up with patient's GU at [facility]. -No abx as no sign of UTI on UA.  Procedures:  None   Consultations:  GU, Dr. Jeffie Pollock  Discharge Instructions  Discharge Orders   Future Orders Complete By Expires   Diet - low sodium heart  healthy  As directed    Discontinue IV  As directed    Increase activity slowly  As directed        Medication List    STOP taking these medications       aspirin EC 81 MG tablet     cephALEXin 500 MG capsule  Commonly known as:  KEFLEX      TAKE these medications       amLODipine 2.5 MG tablet  Commonly known as:  NORVASC  Take 2.5 mg by mouth every morning.     Carboxymethylcellulose Sodium 0.25 % Soln  Place 1-2 drops into both eyes daily as needed (For dry eyes).     celecoxib 200 MG capsule  Commonly known as:  CELEBREX  Take 200 mg by mouth 2 (two) times daily as needed for mild pain.     finasteride 5 MG tablet  Commonly known as:  PROSCAR  Take 5 mg by mouth daily.     fish oil-omega-3 fatty acids 1000 MG capsule  Take 1 g by mouth daily.     multivitamin with minerals Tabs tablet  Take 1 tablet by mouth daily.     omeprazole 40 MG capsule  Commonly known as:  PRILOSEC  Take 40 mg by mouth daily as needed (For acid reflex).     oxyCODONE-acetaminophen 5-325 MG per tablet  Commonly known as:  PERCOCET/ROXICET  Take 0.5-1 tablets by mouth every 4 (four) hours as needed for severe pain.     PRESCRIPTION MEDICATION  Apply 1-2 g topically 2 (two) times daily as needed (neck shoulders and back). Baclofen 2%, cyclobenzaprine 2%, diclofenac 3%, gabapentin 6%, lidocaine 3%     senna 8.6 MG Tabs tablet  Commonly known as:  SENOKOT  Take 1 tablet by mouth daily as needed for mild constipation.     sertraline 100 MG tablet  Commonly known as:  ZOLOFT  Take 100 mg by mouth daily.     tadalafil 5 MG tablet  Commonly known as:  CIALIS  Take 5 mg by mouth daily as needed for erectile dysfunction.     tamsulosin 0.4 MG Caps capsule  Commonly known as:  FLOMAX  Take 0.4 mg by mouth daily.       Allergies  Allergen Reactions  . Arthrotec [Diclofenac-Misoprostol] Diarrhea  . Benazepril Hcl Other (See Comments)    Nasal congestion   . Doxazosin Other (See  Comments)    Chest pain  . Meloxicam Diarrhea       Follow-up Information   Follow up with Jani Gravel, MD. Schedule an appointment as soon as possible for a visit in 2 weeks.   Specialty:  Internal Medicine   Contact information:   [address] [place] [address] [phone]       Follow up with Malka So, MD. Schedule an appointment as soon as possible for a visit in 1 week.   Specialty:  Urology   Contact information:   [place] [address] [phone]        The results of significant diagnostics from this hospitalization (including imaging, microbiology, ancillary and laboratory) are listed below for reference.    Significant Diagnostic Studies: No results found.  Microbiology: Recent Results (from the past 240 hour(s))  URINE CULTURE     Status: None   Collection Time    10/11/13 11:18 AM      Result Value Ref Range Status   Specimen Description URINE, CATHETERIZED   Final   Special Requests NONE   Final   Culture  Setup Time     Final   Value: 10/11/2013 22:18     Performed at [facility]     Final   Value: NO GROWTH     Performed at [facility]   Culture     Final   Value: NO GROWTH     Performed at [facility]   Report Status 10/12/2013 FINAL   Final     Labs: Basic Metabolic Panel:  Recent Labs Lab 10/11/13 1058 10/12/13 2233 10/13/13 0038 10/13/13 0345  NA 126* 135* 135* 139  K 3.8 4.0 3.5* 3.6*  CL 93* 98 98 102  CO2 22  --  23 26  GLUCOSE 117* 105* 121* 100*  BUN 14 10 10 9   CREATININE 0.93 1.20 0.98 0.96  CALCIUM 8.3*  --  8.2* 8.4   Liver Function Tests: No results found for this basename: AST, ALT, ALKPHOS, BILITOT, PROT, ALBUMIN,  in the last 168 hours No results found for this basename: LIPASE, AMYLASE,  in the last 168 hours No results found for this basename: AMMONIA,  in the last 168 hours CBC:  Recent Labs Lab 10/11/13 1057 10/12/13 2220  10/12/13 2233 10/13/13 0345  WBC 12.5* 11.6*  --  9.4  NEUTROABS 9.1* 7.0  --   --   HGB 12.5* 11.2* 11.2* 10.4*  HCT 34.8* 31.3* 33.0* 30.1*  MCV 85.5 86.9  --  87.8  PLT 173 169  --  144*   Cardiac Enzymes: No results found for this basename: CKTOTAL, CKMB, CKMBINDEX, TROPONINI,  in the last 168 hours BNP: BNP (last 3 results) No results found for this basename: PROBNP,  in the last 8760 hours CBG: No results found for this basename: GLUCAP,  in the last 168 hours     Signed:  Erline Hau  [facility] Pager: [phone] 10/13/2013, 3:14 PM

## 2013-10-13 NOTE — Progress Notes (Signed)
ANTIBIOTIC CONSULT NOTE - INITIAL  Pharmacy Consult for Cipro Indication: UTI  Allergies  Allergen Reactions  . Arthrotec [Diclofenac-Misoprostol] Diarrhea  . Benazepril Hcl Other (See Comments)    Nasal congestion   . Doxazosin Other (See Comments)    Chest pain  . Meloxicam Diarrhea    Patient Measurements: Height: 6' (182.9 cm) Weight: 207 lb 10.8 oz (94.2 kg) IBW/kg (Calculated) : 77.6   Vital Signs: Temp: 98.1 F (36.7 C) (05/12 0046) Temp src: Oral (05/12 0046) BP: 166/89 mmHg (05/12 0046) Pulse Rate: 66 (05/12 0046) Intake/Output from previous day: 05/11 0701 - 05/12 0700 In: -  Out: 1500 [Urine:1500] Intake/Output from this shift: Total I/O In: -  Out: 1500 [Urine:1500]  Labs:  Recent Labs  10/11/13 1057 10/11/13 1058 10/12/13 2220 10/12/13 2233  WBC 12.5*  --  11.6*  --   HGB 12.5*  --  11.2* 11.2*  PLT 173  --  169  --   CREATININE  --  0.93  --  1.20   Estimated Creatinine Clearance: 64.3 ml/min (by C-G formula based on Cr of 1.2). No results found for this basename: Letta Median, VANCORANDOM, [place], GENTPEAK, GENTRANDOM, TOBRATROUGH, TOBRAPEAK, TOBRARND, AMIKACINPEAK, AMIKACINTROU, AMIKACIN,  in the last 72 hours   Microbiology: Recent Results (from the past 720 hour(s))  URINE CULTURE     Status: None   Collection Time    10/11/13 11:18 AM      Result Value Ref Range Status   Specimen Description URINE, CATHETERIZED   Final   Special Requests NONE   Final   Culture  Setup Time     Final   Value: 10/11/2013 22:18     Performed at [facility]     Final   Value: NO GROWTH     Performed at [facility]   Culture     Final   Value: NO GROWTH     Performed at [facility]   Report Status 10/12/2013 FINAL   Final    Medical History: Past Medical History  Diagnosis Date  . Hypertension   . Anxiety   . Sleep apnea     no cpap used  . GERD (gastroesophageal reflux disease)   .  History of kidney stones     x1 passed on own  . Neuromuscular disorder     restless leg syndrome  . Cancer     basal and squamous cell cancer lesion  . Arthritis     Osteoarthritis-left hip, s/p arthroplasty knees, right hip    Medications:  Scheduled:  . amLODipine  2.5 mg Oral q morning - 10a  . ciprofloxacin  400 mg Intravenous Q12H  . finasteride  5 mg Oral Daily  . multivitamin with minerals  1 tablet Oral Daily  . omega-3 acid ethyl esters  1 g Oral Daily  . pantoprazole  40 mg Oral Daily  . sertraline  100 mg Oral Daily  . tamsulosin  0.4 mg Oral Daily   Infusions:  . sodium chloride     Assessment: 73 yo s/p TURP 9 days ago at [facility] presents today with increased hematuria and urinary retention.  Cipro for possible UTI per RX.  Goal of Therapy:  Treat infection  Plan:   Cipro 400mg  IV q12h  F/U SCr/cultures as needed  Dorrene German 10/13/2013,12:47 AM

## 2013-10-13 NOTE — Care Management Note (Unsigned)
    Page 1 of 1   10/13/2013     12:45:20 PM CARE MANAGEMENT NOTE 10/13/2013  Patient:  Austin Webb, Austin Webb   Account Number:  [id number]  Date Initiated:  10/13/2013  Documentation initiated by:  Allene Dillon  Subjective/Objective Assessment:   73 year old male admitted with hematuria.     Action/Plan:   From home with spouse.   Anticipated DC Date:  10/16/2013   Anticipated DC Plan:  HOME/SELF CARE         Choice offered to / List presented to:             Status of service:  In process, will continue to follow Medicare Important Message given?   (If response is "NO", the following Medicare IM given date fields will be blank) Date Medicare IM given:   Date Additional Medicare IM given:    Discharge Disposition:    Per UR Regulation:  Reviewed for med. necessity/level of care/duration of stay  If discussed at [place] of Stay Meetings, dates discussed:    Comments:

## 2013-10-13 NOTE — Consult Note (Signed)
Subjective: Austin Webb is a 73 yo WM who I was asked to see in consultation by Dr. Hal Hope for clot retention.   He had a TURP about 2 weeks ago by Dr. Alona Bene at [facility] and had recurrent bleeding over the last several days with subsequent clot retention requiring a foley and irrigation.  He had to come back in last night with an obstructed catheter.   He now has minimally pink urine with his last irrigation at 6 am.   He has minimal residual pain.   He has mild acute blood loss anemia with a further drop in the hgb overnight.  ROS:  Review of Systems  Constitutional: Positive for fever (low grade).  Gastrointestinal: Positive for abdominal pain (minimal). Negative for nausea.    Allergies  Allergen Reactions  . Arthrotec [Diclofenac-Misoprostol] Diarrhea  . Benazepril Hcl Other (See Comments)    Nasal congestion   . Doxazosin Other (See Comments)    Chest pain  . Meloxicam Diarrhea    Past Medical History  Diagnosis Date  . Hypertension   . Anxiety   . Sleep apnea     no cpap used  . GERD (gastroesophageal reflux disease)   . History of kidney stones     x1 passed on own  . Neuromuscular disorder     restless leg syndrome  . Cancer     basal and squamous cell cancer lesion  . Arthritis     Osteoarthritis-left hip, s/p arthroplasty knees, right hip    Past Surgical History  Procedure Laterality Date  . Joint replacement    . Hip arthroplasty Right   . Knee arthroplasty Bilateral   . 4 titanium posts for denture placement    . Hand arthrotomy Right     thumb surgery  . Cholecystectomy      "stones"- Laparoscopic  . Hammer toe surgery Left     and bunionectomy  . Cataract extraction Left   . Eus N/A 02/18/2013    Procedure: ESOPHAGEAL ENDOSCOPIC ULTRASOUND (EUS) RADIAL;  Surgeon: Arta Silence, MD;  Location: [facility];  Service: Endoscopy;  Laterality: N/A;  . Ercp N/A 02/18/2013    Procedure: ENDOSCOPIC RETROGRADE CHOLANGIOPANCREATOGRAPHY (ERCP);   Surgeon: Arta Silence, MD;  Location: [facility];  Service: Endoscopy;  Laterality: N/A;  . Transurethral resection of prostate      History   Social History  . Marital Status: Married    Spouse Name: N/A    Number of Children: N/A  . Years of Education: N/A   Occupational History  . Not on file.   Social History Main Topics  . Smoking status: Former Smoker -- 1.00 packs/day for 12 years    Quit date: 06/04/1982  . Smokeless tobacco: Not on file  . Alcohol Use: Yes     Comment: rare   . Drug Use: No  . Sexual Activity: Yes   Other Topics Concern  . Not on file   Social History Narrative  . No narrative on file    Family History  Problem Relation Age of Onset  . Diabetes Mellitus II Neg Hx   . CAD Neg Hx      Anti-infectives: Anti-infectives   Start     Dose/Rate Route Frequency Ordered Stop   10/13/13 0200  ciprofloxacin (CIPRO) IVPB 400 mg     400 mg 200 mL/hr over 60 Minutes Intravenous Every 12 hours 10/13/13 0041        Current Facility-Administered Medications  Medication  Dose Route Frequency Provider Last Rate Last Dose  . 0.9 %  sodium chloride infusion   Intravenous Continuous Rise Patience, MD 10 mL/hr at 10/13/13 0059    . acetaminophen (TYLENOL) tablet 650 mg  650 mg Oral Q6H PRN Rise Patience, MD       Or  . acetaminophen (TYLENOL) suppository 650 mg  650 mg Rectal Q6H PRN Rise Patience, MD      . amLODipine (NORVASC) tablet 2.5 mg  2.5 mg Oral q morning - 10a Rise Patience, MD      . celecoxib (CELEBREX) capsule 200 mg  200 mg Oral BID PRN Rise Patience, MD      . ciprofloxacin (CIPRO) IVPB 400 mg  400 mg Intravenous Q12H Dorrene German, RPH   400 mg at 10/13/13 0124  . finasteride (PROSCAR) tablet 5 mg  5 mg Oral Daily Rise Patience, MD      . hydrALAZINE (APRESOLINE) injection 10 mg  10 mg Intravenous Q4H PRN Rise Patience, MD      . morphine 2 MG/ML injection 1 mg  1 mg Intravenous Q3H PRN  Rise Patience, MD   1 mg at 10/13/13 0442  . multivitamin with minerals tablet 1 tablet  1 tablet Oral Daily Rise Patience, MD      . omega-3 acid ethyl esters (LOVAZA) capsule 1 g  1 g Oral Daily Rise Patience, MD      . ondansetron [facility]) tablet 4 mg  4 mg Oral Q6H PRN Rise Patience, MD       Or  . ondansetron [facility]) injection 4 mg  4 mg Intravenous Q6H PRN Rise Patience, MD      . oxyCODONE-acetaminophen (PERCOCET/ROXICET) 5-325 MG per tablet 0.5-1 tablet  0.5-1 tablet Oral Q4H PRN Rise Patience, MD      . pantoprazole (PROTONIX) EC tablet 40 mg  40 mg Oral Daily Rise Patience, MD      . polyvinyl alcohol (LIQUIFILM TEARS) 1.4 % ophthalmic solution 1-2 drop  1-2 drop Both Eyes Daily PRN Rise Patience, MD      . senna [facility]) tablet 8.6 mg  1 tablet Oral Daily PRN Rise Patience, MD      . sertraline (ZOLOFT) tablet 100 mg  100 mg Oral Daily Rise Patience, MD      . tamsulosin (FLOMAX) capsule 0.4 mg  0.4 mg Oral Daily Rise Patience, MD         Objective: Vital signs in last 24 hours: Temp:  [97.6 F (36.4 C)-98.1 F (36.7 C)] 97.9 F (36.6 C) (05/12 0543) Pulse Rate:  [65-74] 65 (05/12 0543) Resp:  [16-18] 16 (05/12 0543) BP: (138-166)/(76-97) 151/76 mmHg (05/12 0543) SpO2:  [96 %-98 %] 98 % (05/12 0543) Weight:  [94.2 kg (207 lb 10.8 oz)] 94.2 kg (207 lb 10.8 oz) (05/12 0046)  Intake/Output from previous day: 05/11 0701 - 05/12 0700 In: 530.2 [P.O.:120; I.V.:30.2; IV Piggyback:200] Out: 4200 [Urine:4200] Intake/Output this shift: Total I/O In: -  Out: 500 [Urine:500]   Physical Exam  Constitutional: He is well-developed, well-nourished, and in no distress. No distress.  Abdominal: Soft. He exhibits no mass. There is no tenderness.  Genitourinary:  Foley at the meatus draining light pink urine.     Lab Results:   Recent Labs  10/12/13 2220 10/12/13 2233 10/13/13 0345  WBC 11.6*  --  9.4  HGB  11.2* 11.2* 10.4*  HCT 31.3* 33.0* 30.1*  PLT 169  --  144*   BMET  Recent Labs  10/13/13 0038 10/13/13 0345  NA 135* 139  K 3.5* 3.6*  CL 98 102  CO2 23 26  GLUCOSE 121* 100*  BUN 10 9  CREATININE 0.98 0.96  CALCIUM 8.2* 8.4   PT/INR No results found for this basename: LABPROT, INR,  in the last 72 hours ABG No results found for this basename: PHART, PCO2, PO2, HCO3,  in the last 72 hours  Studies/Results: I have reviewed his recent labs and discussed the case with Dr. Hal Hope   Assessment: Clot retention post TURP with acute blood loss anemia now with no apparent bleeding.     Plan: I am going to have his foley discontinued and keep a string of bottles.   He should be able to be discharged home later today if he is able to void.  He should f/u with Dr. Amalia Hailey as planned.   CC: Dr. Gean Birchwood and Dr. Alona Bene at [facility]    LOS: 1 day    Irine Seal 10/13/2013

## 2013-10-13 NOTE — H&P (Signed)
[facility] History and Physical  Austin Webb ION:[id number] DOB: 1940-07-09 DOA: 10/12/2013  Referring physician: ER physician. PCP: Jani Gravel, MD   Chief Complaint: Urinary retention.  HPI: Austin Webb is a 73 y.o. male with history of hypertension who has had a recent TURP done at [facility] 10 days ago had come to the ER yesterday because patient was unable to void even and patient was sent home after Foley catheter was placed and was given Keflex antibiotics since patient's urine was showing possible UTI and was advised to follow with patient's urologist. Since morning patient states that he was having increasing hematuria with clots and his Foley catheter was getting blocked. He came back to the ER again and he had radiation done but his Foley was getting blocked again and at this point Dr. Roni Bread, on-call urologist advised admission and close followup. Dr. Roni Bread will be following as consult. Patient otherwise denies any fever chills chest pain shortness of breath nausea vomiting or diarrhea.  Review of Systems: As presented in the history of presenting illness, rest negative.  Past Medical History  Diagnosis Date  . Hypertension   . Anxiety   . Sleep apnea     no cpap used  . GERD (gastroesophageal reflux disease)   . History of kidney stones     x1 passed on own  . Neuromuscular disorder     restless leg syndrome  . Cancer     basal and squamous cell cancer lesion  . Arthritis     Osteoarthritis-left hip, s/p arthroplasty knees, right hip   Past Surgical History  Procedure Laterality Date  . Joint replacement    . Hip arthroplasty Right   . Knee arthroplasty Bilateral   . 4 titanium posts for denture placement    . Hand arthrotomy Right     thumb surgery  . Cholecystectomy      "stones"- Laparoscopic  . Hammer toe surgery Left     and bunionectomy  . Cataract extraction Left   . Eus N/A 02/18/2013    Procedure: ESOPHAGEAL ENDOSCOPIC ULTRASOUND  (EUS) RADIAL;  Surgeon: Arta Silence, MD;  Location: [facility] ENDOS[facility];  Service: Endoscopy;  Laterality: N/A;  . Ercp N/A 02/18/2013    Procedure: ENDOSCOPIC RETROGRADE CHOLANGIOPANCREATOGRAPHY (ERCP);  Surgeon: Arta Silence, MD;  Location: [facility];  Service: Endoscopy;  Laterality: N/A;  . Transurethral resection of prostate     Social History:  reports that he quit smoking about 31 years ago. He does not have any smokeless tobacco history on file. He reports that he drinks alcohol. He reports that he does not use illicit drugs. Where does patient live home. Can patient participate in ADLs? Yes.  Allergies  Allergen Reactions  . Arthrotec [Diclofenac-Misoprostol] Diarrhea  . Benazepril Hcl Other (See Comments)    Nasal congestion   . Doxazosin Other (See Comments)    Chest pain  . Meloxicam Diarrhea    Family History:  Family History  Problem Relation Age of Onset  . Diabetes Mellitus II Neg Hx   . CAD Neg Hx       Prior to Admission medications   Medication Sig Start Date End Date Taking? Authorizing Provider  amLODipine (NORVASC) 2.5 MG tablet Take 2.5 mg by mouth every morning.    Historical Provider, MD  Carboxymethylcellulose Sodium 0.25 % SOLN Place 1-2 drops into both eyes daily as needed (For dry eyes).    Historical Provider, MD  celecoxib (CELEBREX) 200 MG capsule Take 200  mg by mouth 2 (two) times daily as needed for mild pain.    Historical Provider, MD  cephALEXin (KEFLEX) 500 MG capsule Take 1 capsule (500 mg total) by mouth 4 (four) times daily. 10/11/13   Leota Jacobsen, MD  finasteride (PROSCAR) 5 MG tablet Take 5 mg by mouth daily.     Historical Provider, MD  fish oil-omega-3 fatty acids 1000 MG capsule Take 1 g by mouth daily.    Historical Provider, MD  Multiple Vitamin (MULTIVITAMIN WITH MINERALS) TABS tablet Take 1 tablet by mouth daily.    Historical Provider, MD  omeprazole (PRILOSEC) 40 MG capsule Take 40 mg by mouth daily as needed (For acid reflex).      Historical Provider, MD  oxyCODONE-acetaminophen (PERCOCET/ROXICET) 5-325 MG per tablet Take 0.5-1 tablets by mouth every 4 (four) hours as needed for severe pain.    Historical Provider, MD  PRESCRIPTION MEDICATION Apply 1-2 g topically 2 (two) times daily as needed. Baclofen 2%, cyclobenzaprine 2%, diclofenac 3%, gabapentin 6%, lidocaine 3%    Historical Provider, MD  senna (SENOKOT) 8.6 MG TABS tablet Take 1 tablet by mouth daily as needed for mild constipation.    Historical Provider, MD  sertraline (ZOLOFT) 100 MG tablet Take 100 mg by mouth daily.     Historical Provider, MD  tadalafil (CIALIS) 5 MG tablet Take 5 mg by mouth daily as needed for erectile dysfunction.    Historical Provider, MD  tamsulosin (FLOMAX) 0.4 MG CAPS capsule Take 0.4 mg by mouth daily.    Historical Provider, MD    Physical Exam: Filed Vitals:   10/12/13 2156 10/12/13 2324  BP: 157/97 138/82  Pulse: 74 72  Temp: 97.8 F (36.6 C) 97.6 F (36.4 C)  TempSrc: Oral Oral  Resp: 18 18  SpO2: 97% 98%     General:  Well-developed and nourished.  Eyes: Anicteric no pallor.  ENT: No discharge from the ears eyes nose mouth.  Neck: No mass felt.  Cardiovascular: S1-S2 heard.  Respiratory: No rhonchi or crepitations.  Abdomen: Soft nontender bowel sounds present. Foley catheter is seen.  Skin: No rash.  Musculoskeletal: No edema.  Psychiatric: Appears normal.  Neurologic: Alert awake oriented to time place and person.  Labs on Admission:  Basic Metabolic Panel:  Recent Labs Lab 10/11/13 1058 10/12/13 2233  NA 126* 135*  K 3.8 4.0  CL 93* 98  CO2 22  --   GLUCOSE 117* 105*  BUN 14 10  CREATININE 0.93 1.20  CALCIUM 8.3*  --    Liver Function Tests: No results found for this basename: AST, ALT, ALKPHOS, BILITOT, PROT, ALBUMIN,  in the last 168 hours No results found for this basename: LIPASE, AMYLASE,  in the last 168 hours No results found for this basename: AMMONIA,  in the last 168  hours CBC:  Recent Labs Lab 10/11/13 1057 10/12/13 2220 10/12/13 2233  WBC 12.5* 11.6*  --   NEUTROABS 9.1* 7.0  --   HGB 12.5* 11.2* 11.2*  HCT 34.8* 31.3* 33.0*  MCV 85.5 86.9  --   PLT 173 169  --    Cardiac Enzymes: No results found for this basename: CKTOTAL, CKMB, CKMBINDEX, TROPONINI,  in the last 168 hours  BNP (last 3 results) No results found for this basename: PROBNP,  in the last 8760 hours CBG: No results found for this basename: GLUCAP,  in the last 168 hours  Radiological Exams on Admission: No results found.   Assessment/Plan Principal Problem:  Obstructive uropathy Active Problems:   HTN (hypertension)   Anemia   S/P TURP   Bladder outlet obstruction   1. Urinary retention secondary to obstruction secondary to hematuria - I have discussed with on-call urologist Dr. Roni Bread who has advised to continue with irrigation as needed and not continuous. Patient will be on empiric Cipro as patient's recent UA done 2 days ago showed possible UTI. When necessary pain medications. Further recommendations per Dr. Roni Bread. 2. Hypertension - continue present medications. 3. Anemia probably secondary to blood loss - follow CBC. If no significant fall in hemoglobin further workup as outpatient.    Code Status: Full code.  Family Communication: Patient's wife.  Disposition Plan: Admit for observation.    [facility] Pager [phone].  If 7PM-7AM, please contact night-coverage [email or web address] Password [email or web address] 10/13/2013, 12:30 AM

## 2013-11-03 ENCOUNTER — Other Ambulatory Visit (HOSPITAL_COMMUNITY): Payer: Self-pay | Admitting: Internal Medicine

## 2013-11-03 ENCOUNTER — Observation Stay (HOSPITAL_COMMUNITY)
Admission: EM | Admit: 2013-11-03 | Discharge: 2013-11-04 | Disposition: A | Discharge status: Home / Self Care | Payer: Medicare Other | Attending: Surgery | Admitting: Surgery

## 2013-11-03 ENCOUNTER — Encounter (HOSPITAL_COMMUNITY): Payer: Self-pay | Admitting: Emergency Medicine

## 2013-11-03 ENCOUNTER — Encounter (HOSPITAL_COMMUNITY): Payer: Medicare Other | Admitting: Certified Registered"

## 2013-11-03 ENCOUNTER — Emergency Department (HOSPITAL_COMMUNITY): Payer: Medicare Other | Admitting: Certified Registered"

## 2013-11-03 ENCOUNTER — Ambulatory Visit (HOSPITAL_COMMUNITY)
Admission: RE | Admit: 2013-11-03 | Discharge: 2013-11-03 | Disposition: A | Discharge status: Home / Self Care | Payer: Medicare Other | Source: Ambulatory Visit | Attending: Internal Medicine | Admitting: Internal Medicine

## 2013-11-03 ENCOUNTER — Encounter (HOSPITAL_COMMUNITY)
Admission: EM | Disposition: A | Discharge status: Home / Self Care | Payer: Medicare Other | Source: Home / Self Care | Attending: Emergency Medicine

## 2013-11-03 ENCOUNTER — Other Ambulatory Visit: Payer: Self-pay

## 2013-11-03 DIAGNOSIS — R1031 Right lower quadrant pain: Secondary | ICD-10-CM

## 2013-11-03 DIAGNOSIS — R52 Pain, unspecified: Secondary | ICD-10-CM

## 2013-11-03 DIAGNOSIS — Z9089 Acquired absence of other organs: Secondary | ICD-10-CM | POA: Insufficient documentation

## 2013-11-03 DIAGNOSIS — F411 Generalized anxiety disorder: Secondary | ICD-10-CM | POA: Insufficient documentation

## 2013-11-03 DIAGNOSIS — M1610 Unilateral primary osteoarthritis, unspecified hip: Secondary | ICD-10-CM | POA: Insufficient documentation

## 2013-11-03 DIAGNOSIS — K3580 Unspecified acute appendicitis: Secondary | ICD-10-CM

## 2013-11-03 DIAGNOSIS — Z87891 Personal history of nicotine dependence: Secondary | ICD-10-CM | POA: Insufficient documentation

## 2013-11-03 DIAGNOSIS — Z9049 Acquired absence of other specified parts of digestive tract: Secondary | ICD-10-CM

## 2013-11-03 DIAGNOSIS — R110 Nausea: Secondary | ICD-10-CM

## 2013-11-03 DIAGNOSIS — I10 Essential (primary) hypertension: Secondary | ICD-10-CM | POA: Insufficient documentation

## 2013-11-03 DIAGNOSIS — G709 Myoneural disorder, unspecified: Secondary | ICD-10-CM | POA: Insufficient documentation

## 2013-11-03 DIAGNOSIS — M169 Osteoarthritis of hip, unspecified: Secondary | ICD-10-CM | POA: Insufficient documentation

## 2013-11-03 DIAGNOSIS — Z96649 Presence of unspecified artificial hip joint: Secondary | ICD-10-CM | POA: Insufficient documentation

## 2013-11-03 DIAGNOSIS — R109 Unspecified abdominal pain: Secondary | ICD-10-CM

## 2013-11-03 DIAGNOSIS — K219 Gastro-esophageal reflux disease without esophagitis: Secondary | ICD-10-CM | POA: Insufficient documentation

## 2013-11-03 DIAGNOSIS — K432 Incisional hernia without obstruction or gangrene: Secondary | ICD-10-CM | POA: Insufficient documentation

## 2013-11-03 DIAGNOSIS — M1710 Unilateral primary osteoarthritis, unspecified knee: Secondary | ICD-10-CM | POA: Insufficient documentation

## 2013-11-03 DIAGNOSIS — Z79899 Other long term (current) drug therapy: Secondary | ICD-10-CM | POA: Insufficient documentation

## 2013-11-03 DIAGNOSIS — Z96659 Presence of unspecified artificial knee joint: Secondary | ICD-10-CM | POA: Insufficient documentation

## 2013-11-03 DIAGNOSIS — G2581 Restless legs syndrome: Secondary | ICD-10-CM | POA: Insufficient documentation

## 2013-11-03 DIAGNOSIS — R339 Retention of urine, unspecified: Secondary | ICD-10-CM | POA: Insufficient documentation

## 2013-11-03 DIAGNOSIS — G4730 Sleep apnea, unspecified: Secondary | ICD-10-CM | POA: Insufficient documentation

## 2013-11-03 DIAGNOSIS — K3533 Acute appendicitis with perforation and localized peritonitis, with abscess: Principal | ICD-10-CM | POA: Insufficient documentation

## 2013-11-03 HISTORY — PX: LAPAROSCOPIC APPENDECTOMY: SHX408

## 2013-11-03 LAB — CBC
HCT: 38.4 % — ABNORMAL LOW (ref 39.0–52.0)
Hemoglobin: 13 g/dL (ref 13.0–17.0)
MCH: 28.8 pg (ref 26.0–34.0)
MCHC: 33.9 g/dL (ref 30.0–36.0)
MCV: 85.1 fL (ref 78.0–100.0)
PLATELETS: 187 10*3/uL (ref 150–400)
RBC: 4.51 MIL/uL (ref 4.22–5.81)
RDW: 12.4 % (ref 11.5–15.5)
WBC: 15 10*3/uL — ABNORMAL HIGH (ref 4.0–10.5)

## 2013-11-03 LAB — BASIC METABOLIC PANEL
BUN: 16 mg/dL (ref 6–23)
CALCIUM: 9.3 mg/dL (ref 8.4–10.5)
CO2: 24 meq/L (ref 19–32)
CREATININE: 0.96 mg/dL (ref 0.50–1.35)
Chloride: 98 mEq/L (ref 96–112)
GFR calc Af Amer: >90 mL/min (ref >90)
GFR calc non Af Amer: 80 mL/min — ABNORMAL LOW (ref >90)
Glucose, Bld: 108 mg/dL — ABNORMAL HIGH (ref 70–99)
Potassium: 3.8 mEq/L (ref 3.7–5.3)
Sodium: 136 mEq/L — ABNORMAL LOW (ref 137–147)

## 2013-11-03 LAB — ABO/RH: ABO/RH(D): A POS

## 2013-11-03 LAB — TYPE AND SCREEN
ABO/RH(D): A POS
Antibody Screen: NEGATIVE

## 2013-11-03 SURGERY — APPENDECTOMY, LAPAROSCOPIC
Anesthesia: General | Site: Abdomen

## 2013-11-03 MED ORDER — NEOSTIGMINE METHYLSULFATE 10 MG/10ML IV SOLN
INTRAVENOUS | Status: DC | PRN
  Administered 2013-11-03: 5 mg via INTRAVENOUS

## 2013-11-03 MED ORDER — PROMETHAZINE HCL 25 MG/ML IJ SOLN: 6.2500 mg | INTRAMUSCULAR | Status: DC | PRN

## 2013-11-03 MED ORDER — PROPOFOL 10 MG/ML IV BOLUS
INTRAVENOUS | Status: DC | PRN
  Administered 2013-11-03: 160 mg via INTRAVENOUS

## 2013-11-03 MED ORDER — LACTATED RINGERS IR SOLN
Status: DC | PRN
  Administered 2013-11-03: 1000 mL

## 2013-11-03 MED ORDER — PHENYLEPHRINE 40 MCG/ML (10ML) SYRINGE FOR IV PUSH (FOR BLOOD PRESSURE SUPPORT)
PREFILLED_SYRINGE | INTRAVENOUS | Status: AC
  Filled 2013-11-03: qty 10

## 2013-11-03 MED ORDER — HEPARIN SODIUM (PORCINE) 5000 UNIT/ML IJ SOLN
5000.0000 [IU] | Freq: Three times a day (TID) | INTRAMUSCULAR | Status: DC
  Administered 2013-11-04 (×2): 5000 [IU] via SUBCUTANEOUS
  Filled 2013-11-03 (×5): qty 1

## 2013-11-03 MED ORDER — PROPOFOL 10 MG/ML IV BOLUS
INTRAVENOUS | Status: AC
  Filled 2013-11-03: qty 20

## 2013-11-03 MED ORDER — METRONIDAZOLE IN NACL 5-0.79 MG/ML-% IV SOLN
500.0000 mg | Freq: Three times a day (TID) | INTRAVENOUS | Status: DC
  Administered 2013-11-04: 500 mg via INTRAVENOUS
  Filled 2013-11-03 (×2): qty 100

## 2013-11-03 MED ORDER — ACETAMINOPHEN 325 MG PO TABS: 650.0000 mg | ORAL_TABLET | Freq: Four times a day (QID) | ORAL | Status: DC | PRN

## 2013-11-03 MED ORDER — ONDANSETRON HCL 4 MG/2ML IJ SOLN: 4.0000 mg | Freq: Four times a day (QID) | INTRAMUSCULAR | Status: DC | PRN

## 2013-11-03 MED ORDER — BUPIVACAINE HCL 0.25 % IJ SOLN
INTRAMUSCULAR | Status: DC | PRN
  Administered 2013-11-03: 15 mL

## 2013-11-03 MED ORDER — SENNA 8.6 MG PO TABS: 1.0000 | ORAL_TABLET | Freq: Every day | ORAL | Status: DC | PRN

## 2013-11-03 MED ORDER — BUPIVACAINE HCL (PF) 0.25 % IJ SOLN
INTRAMUSCULAR | Status: AC
  Filled 2013-11-03: qty 30

## 2013-11-03 MED ORDER — ACETAMINOPHEN 650 MG RE SUPP: 650.0000 mg | Freq: Four times a day (QID) | RECTAL | Status: DC | PRN

## 2013-11-03 MED ORDER — PANTOPRAZOLE SODIUM 40 MG PO TBEC
40.0000 mg | DELAYED_RELEASE_TABLET | Freq: Every day | ORAL | Status: DC
  Administered 2013-11-04: 40 mg via ORAL
  Filled 2013-11-03: qty 1

## 2013-11-03 MED ORDER — IOHEXOL 300 MG/ML  SOLN
100.0000 mL | Freq: Once | INTRAMUSCULAR | Status: AC | PRN
  Administered 2013-11-03: 100 mL via INTRAVENOUS

## 2013-11-03 MED ORDER — HYDROMORPHONE HCL PF 1 MG/ML IJ SOLN
1.0000 mg | INTRAMUSCULAR | Status: DC | PRN
  Administered 2013-11-04: 1 mg via INTRAVENOUS
  Filled 2013-11-03: qty 1

## 2013-11-03 MED ORDER — SERTRALINE HCL 100 MG PO TABS
100.0000 mg | ORAL_TABLET | Freq: Every day | ORAL | Status: DC
  Administered 2013-11-04: 100 mg via ORAL
  Filled 2013-11-03: qty 1

## 2013-11-03 MED ORDER — METRONIDAZOLE IN NACL 5-0.79 MG/ML-% IV SOLN
500.0000 mg | Freq: Once | INTRAVENOUS | Status: AC
  Administered 2013-11-03: 500 mg via INTRAVENOUS
  Filled 2013-11-03: qty 100

## 2013-11-03 MED ORDER — FINASTERIDE 5 MG PO TABS
5.0000 mg | ORAL_TABLET | Freq: Every day | ORAL | Status: DC
  Administered 2013-11-04: 5 mg via ORAL
  Filled 2013-11-03: qty 1

## 2013-11-03 MED ORDER — LIDOCAINE HCL (CARDIAC) 20 MG/ML IV SOLN
INTRAVENOUS | Status: AC
  Filled 2013-11-03: qty 5

## 2013-11-03 MED ORDER — OXYCODONE-ACETAMINOPHEN 5-325 MG PO TABS: 1.0000 | ORAL_TABLET | ORAL | Status: DC | PRN

## 2013-11-03 MED ORDER — ONDANSETRON HCL 4 MG/2ML IJ SOLN
INTRAMUSCULAR | Status: DC | PRN
  Administered 2013-11-03: 4 mg via INTRAVENOUS

## 2013-11-03 MED ORDER — FENTANYL CITRATE 0.05 MG/ML IJ SOLN
INTRAMUSCULAR | Status: AC
  Filled 2013-11-03: qty 2

## 2013-11-03 MED ORDER — CISATRACURIUM BESYLATE 20 MG/10ML IV SOLN
INTRAVENOUS | Status: AC
  Filled 2013-11-03: qty 10

## 2013-11-03 MED ORDER — ONDANSETRON HCL 4 MG/2ML IJ SOLN
INTRAMUSCULAR | Status: AC
  Filled 2013-11-03: qty 2

## 2013-11-03 MED ORDER — FENTANYL CITRATE 0.05 MG/ML IJ SOLN
INTRAMUSCULAR | Status: DC | PRN
  Administered 2013-11-03: 50 ug via INTRAVENOUS
  Administered 2013-11-03: 100 ug via INTRAVENOUS
  Administered 2013-11-03: 50 ug via INTRAVENOUS

## 2013-11-03 MED ORDER — CISATRACURIUM BESYLATE (PF) 10 MG/5ML IV SOLN
INTRAVENOUS | Status: DC | PRN
  Administered 2013-11-03: 6 mg via INTRAVENOUS

## 2013-11-03 MED ORDER — CEFAZOLIN SODIUM-DEXTROSE 2-3 GM-% IV SOLR
2.0000 g | Freq: Three times a day (TID) | INTRAVENOUS | Status: DC
Start: 2013-11-04 — End: 2013-11-04
  Administered 2013-11-04: 2 g via INTRAVENOUS
  Filled 2013-11-03 (×2): qty 50

## 2013-11-03 MED ORDER — SODIUM CHLORIDE 0.9 % IV SOLN
INTRAVENOUS | Status: DC
  Administered 2013-11-04: via INTRAVENOUS

## 2013-11-03 MED ORDER — TAMSULOSIN HCL 0.4 MG PO CAPS
0.4000 mg | ORAL_CAPSULE | Freq: Every day | ORAL | Status: DC
  Administered 2013-11-04: 0.4 mg via ORAL
  Filled 2013-11-03: qty 1

## 2013-11-03 MED ORDER — FENTANYL CITRATE 0.05 MG/ML IJ SOLN: 25.0000 ug | INTRAMUSCULAR | Status: DC | PRN

## 2013-11-03 MED ORDER — PHENYLEPHRINE HCL 10 MG/ML IJ SOLN
INTRAMUSCULAR | Status: DC | PRN
  Administered 2013-11-03: 120 ug via INTRAVENOUS
  Administered 2013-11-03: 80 ug via INTRAVENOUS

## 2013-11-03 MED ORDER — CEFAZOLIN SODIUM-DEXTROSE 2-3 GM-% IV SOLR
2.0000 g | Freq: Three times a day (TID) | INTRAVENOUS | Status: DC
  Administered 2013-11-03: 2 g via INTRAVENOUS
  Filled 2013-11-03 (×2): qty 50

## 2013-11-03 MED ORDER — 0.9 % SODIUM CHLORIDE (POUR BTL) OPTIME
TOPICAL | Status: DC | PRN
  Administered 2013-11-03: 1000 mL

## 2013-11-03 MED ORDER — SUCCINYLCHOLINE CHLORIDE 20 MG/ML IJ SOLN
INTRAMUSCULAR | Status: DC | PRN
  Administered 2013-11-03: 100 mg via INTRAVENOUS

## 2013-11-03 MED ORDER — IOHEXOL 300 MG/ML  SOLN
50.0000 mL | Freq: Once | INTRAMUSCULAR | Status: AC | PRN
  Administered 2013-11-03: 50 mL via ORAL

## 2013-11-03 MED ORDER — MEPERIDINE HCL 50 MG/ML IJ SOLN: 6.2500 mg | INTRAMUSCULAR | Status: DC | PRN

## 2013-11-03 MED ORDER — AMLODIPINE BESYLATE 2.5 MG PO TABS
2.5000 mg | ORAL_TABLET | Freq: Every morning | ORAL | Status: DC
  Administered 2013-11-04: 2.5 mg via ORAL
  Filled 2013-11-03: qty 1

## 2013-11-03 MED ORDER — LACTATED RINGERS IV SOLN
INTRAVENOUS | Status: DC | PRN
  Administered 2013-11-03 (×2): via INTRAVENOUS

## 2013-11-03 MED ORDER — LACTATED RINGERS IV SOLN: INTRAVENOUS | Status: DC

## 2013-11-03 MED ORDER — GLYCOPYRROLATE 0.2 MG/ML IJ SOLN
INTRAMUSCULAR | Status: DC | PRN
  Administered 2013-11-03: .8 mg via INTRAVENOUS

## 2013-11-03 SURGICAL SUPPLY — 50 items
ADH SKN CLS APL DERMABOND .7 (GAUZE/BANDAGES/DRESSINGS) ×1
BAG SPEC RTRVL 10 TROC 200 (ENDOMECHANICALS) ×1
BAG SPEC RTRVL LRG 6X4 10 (ENDOMECHANICALS)
CABLE HIGH FREQUENCY MONO STRZ (ELECTRODE) ×2 IMPLANT
CANISTER SUCTION 2500CC (MISCELLANEOUS) ×1 IMPLANT
CHLORAPREP W/TINT 26ML (MISCELLANEOUS) ×2 IMPLANT
COVER SURGICAL LIGHT HANDLE (MISCELLANEOUS) ×2 IMPLANT
CUTTER FLEX LINEAR 45M (STAPLE) ×2 IMPLANT
DECANTER SPIKE VIAL GLASS SM (MISCELLANEOUS) ×1 IMPLANT
DERMABOND ADVANCED (GAUZE/BANDAGES/DRESSINGS) ×1
DERMABOND ADVANCED .7 DNX12 (GAUZE/BANDAGES/DRESSINGS) ×1 IMPLANT
DRAPE LAPAROSCOPIC ABDOMINAL (DRAPES) ×2 IMPLANT
ELECT REM PT RETURN 9FT ADLT (ELECTROSURGICAL) ×2
ELECTRODE REM PT RTRN 9FT ADLT (ELECTROSURGICAL) ×1 IMPLANT
GLOVE BIO SURGEON STRL SZ7 (GLOVE) IMPLANT
GLOVE BIO SURGEON STRL SZ7.5 (GLOVE) ×2 IMPLANT
GLOVE BIOGEL PI IND STRL 7.0 (GLOVE) IMPLANT
GLOVE BIOGEL PI IND STRL 7.5 (GLOVE) ×2 IMPLANT
GLOVE BIOGEL PI INDICATOR 7.0 (GLOVE)
GLOVE BIOGEL PI INDICATOR 7.5 (GLOVE) ×3
GLOVE SURG SS PI 7.5 STRL IVOR (GLOVE) ×2 IMPLANT
GOWN STRL REUS W/ TWL XL LVL3 (GOWN DISPOSABLE) ×1 IMPLANT
GOWN STRL REUS W/TWL LRG LVL3 (GOWN DISPOSABLE) ×2 IMPLANT
GOWN STRL REUS W/TWL XL LVL3 (GOWN DISPOSABLE) ×3 IMPLANT
KIT BASIN OR (CUSTOM PROCEDURE TRAY) ×2 IMPLANT
MANIFOLD NEPTUNE II (INSTRUMENTS) ×1 IMPLANT
NS IRRIG 1000ML POUR BTL (IV SOLUTION) ×2 IMPLANT
PENCIL BUTTON HOLSTER BLD 10FT (ELECTRODE) ×2 IMPLANT
POUCH RETRIEVAL ECOSAC 10 (ENDOMECHANICALS) IMPLANT
POUCH RETRIEVAL ECOSAC 10MM (ENDOMECHANICALS) ×1
POUCH SPECIMEN RETRIEVAL 10MM (ENDOMECHANICALS) ×1 IMPLANT
RELOAD 45 VASCULAR/THIN (ENDOMECHANICALS) IMPLANT
RELOAD STAPLE 45 2.5 WHT GRN (ENDOMECHANICALS) IMPLANT
RELOAD STAPLE 45 3.5 BLU ETS (ENDOMECHANICALS) ×1 IMPLANT
RELOAD STAPLE TA45 3.5 REG BLU (ENDOMECHANICALS) ×2 IMPLANT
SET IRRIG TUBING LAPAROSCOPIC (IRRIGATION / IRRIGATOR) ×2 IMPLANT
SHEARS HARMONIC ACE PLUS 36CM (ENDOMECHANICALS) ×2 IMPLANT
SOLUTION ANTI FOG 6CC (MISCELLANEOUS) ×2 IMPLANT
SUT MNCRL AB 4-0 PS2 18 (SUTURE) ×2 IMPLANT
SUT NOVA 0 T19/GS 22DT (SUTURE) ×1 IMPLANT
SUT VIC AB 3-0 PS2 18 (SUTURE) ×2
SUT VIC AB 3-0 PS2 18XBRD (SUTURE) IMPLANT
SUT VICRYL 0 ENDOLOOP (SUTURE) IMPLANT
TOWEL OR 17X26 10 PK STRL BLUE (TOWEL DISPOSABLE) ×2 IMPLANT
TRAY FOLEY CATH 14FRSI W/METER (CATHETERS) IMPLANT
TRAY FOLEY CATH 16FRSI W/METER (SET/KITS/TRAYS/PACK) ×1 IMPLANT
TRAY LAP CHOLE (CUSTOM PROCEDURE TRAY) ×2 IMPLANT
TROCAR BLADELESS OPT 5 75 (ENDOMECHANICALS) ×4 IMPLANT
TROCAR XCEL BLUNT TIP 100MML (ENDOMECHANICALS) ×2 IMPLANT
TUBING INSUFFLATION 10FT LAP (TUBING) ×2 IMPLANT

## 2013-11-03 NOTE — Op Note (Signed)
Preoperative diagnosis: Acute appendicitis, incisional hernia (umbilical) Postoperative diagnosis: Same as above  Procedure: Laparoscopic appendectomy  Primary incisional umbilical hernia repair with suture Surgeon: Dr. Serita Grammes  Anesthesia: Gen.  Estimated blood loss: Minimal  Complications: None  Drains: None  Specimens: Appendix to pathology  Sponge and needle count was correct at completion  Disposition to recovery stable  Indications: This is a 73 yo male who has less than 24 hours of abdominal pain primarily in the right lower quadrant. He has a clinical exam consistent with appendicitis. He has an elevated white blood cell count and a CT that appears to show appendicitis as well. I discussed a diagnostic laparoscopy and appendectomy with the risks and benefits associated with that.   Procedure: After informed consent was obtained the patient was taken to the operating room. He received cefazolin and metronidazole. Sequential compression devices were on his legs. He was placed under general anesthesia without complication. A Foley catheter and an orogastric tube were placed.  Her abdomen was then prepped and draped in the standard sterile surgical fashion. Surgical timeout was performed.  I reentered his old vertical infraumbilical incision and noted he had an incisional hernia at that site.  I entered into his peritoneum through the hernia after lifting the umbilicus up.  I placed a 0 vicryl pursestring through the fascia then inserted a hasson trocar. The abdomen was then insufflated to 15 mm Hg pressure.. I then inserted a 5 mm port in the suprapubic region under direct vision without complication as well as one in the left lower quadrant.  He did have acute suppurative appendicitis. I was able to trace the appendix back to the base. I divided the mesentery with the Harmonic scalpel. I then located the base of the appendix which was viable. I then came across this with a GIA stapler.  The appendix was then placed in a bag and removed from the umbilical port. I then inspected by staple line. This was hemostatic. I inspected the terminal ileum as well as the cecum for any injuries of which there were none. I then put the omentum in the right lower quadrant.I then removed the  trocars and desufflated the abdomen. I then closed the incisional hernia at his umbilicus with three 0 novafil figure of eight sutures without tension.  The pursestring was also tied down.  I tacked the umbilicus down with a 3-0 vicryl.  I closed the incisions with 4-0 Monocryl and Dermabond. He tolerated this well was extubated and transferred to recovery stable.

## 2013-11-03 NOTE — ED Notes (Signed)
Bed: WA08 Expected date:  Expected time:  Means of arrival:  Comments: Triage 1 

## 2013-11-03 NOTE — Transfer of Care (Signed)
Immediate Anesthesia Transfer of Care Note  Patient: Austin Webb  Procedure(s) Performed: Procedure(s): APPENDECTOMY LAPAROSCOPIC/ PRIMARY INCISIONAL HERNIA REPAIR (N/A)  Patient Location: PACU  Anesthesia Type:General  Level of Consciousness: awake, sedated and patient cooperative  Airway & Oxygen Therapy: Patient Spontanous Breathing and Patient connected to face mask oxygen  Post-op Assessment: Report given to PACU RN and Post -op Vital signs reviewed and stable  Post vital signs: Reviewed and stable  Complications: No apparent anesthesia complications

## 2013-11-03 NOTE — Anesthesia Preprocedure Evaluation (Signed)
Anesthesia Evaluation  Patient identified by MRN, date of birth, ID band Patient awake    Reviewed: Allergy & Precautions, H&P , NPO status , Patient's Chart, lab work & pertinent test results  Airway Mallampati: II TM Distance: >3 FB Neck ROM: Full    Dental  (+) Upper Dentures, Lower Dentures, Dental Advisory Given   Pulmonary neg pulmonary ROS, sleep apnea , former smoker,  breath sounds clear to auscultation  Pulmonary exam normal       Cardiovascular hypertension, Pt. on medications Rhythm:Regular Rate:Normal     Neuro/Psych Anxiety  Neuromuscular disease negative psych ROS   GI/Hepatic Neg liver ROS, GERD-  ,  Endo/Other  negative endocrine ROS  Renal/GU negative Renal ROS  negative genitourinary   Musculoskeletal  (+) Arthritis -, Osteoarthritis,    Abdominal   Peds  Hematology negative hematology ROS (+)   Anesthesia Other Findings X4 dental post for lower denture; discussed leaving lower dental plate in for endoscopic procedure, to protect post and prevent further damage occuring to upper gum.  Reproductive/Obstetrics                           Anesthesia Physical  Anesthesia Plan  ASA: II and emergent  Anesthesia Plan: General   Post-op Pain Management:    Induction: Intravenous  Airway Management Planned: Oral ETT  Additional Equipment:   Intra-op Plan:   Post-operative Plan: Extubation in OR  Informed Consent: I have reviewed the patients History and Physical, chart, labs and discussed the procedure including the risks, benefits and alternatives for the proposed anesthesia with the patient or authorized representative who has indicated his/her understanding and acceptance.   Dental advisory given  Plan Discussed with: CRNA  Anesthesia Plan Comments:         Anesthesia Quick Evaluation

## 2013-11-03 NOTE — ED Notes (Signed)
OR called-advised not to give antibiotics. Hanging in room and sent with patient.

## 2013-11-03 NOTE — ED Notes (Signed)
Patient states he started having abd pain at 2300 last night, was seen earlier today for CT scan and was dx with appendicitis.

## 2013-11-03 NOTE — ED Notes (Signed)
General surgeon here to see patient. Patient placed in Triage 1 for privacy.

## 2013-11-03 NOTE — H&P (Signed)
Austin Webb is an 73 y.o. male.   Chief Complaint: abdominal pain HPI: 37 yom who has recent history of a TURP complicated by clot retention and foley placement. Has been doing well since mid May.  In last 24 hours he has developed significant rlq pain that has worsened.  Worse with movement and laughter.  He has not had any relief. This has been progressive. Anorectic.  No fever, no emesis, some nausea. Has history of colonoscopy he says have been fine.  He was sent for outpt ct and found to have appendicitis and I was consulted  Past Medical History  Diagnosis Date  . Hypertension   . Anxiety   . Sleep apnea     no cpap used  . GERD (gastroesophageal reflux disease)   . History of kidney stones     x1 passed on own  . Neuromuscular disorder     restless leg syndrome  . Cancer     basal and squamous cell cancer lesion  . Arthritis     Osteoarthritis-left hip, s/p arthroplasty knees, right hip    Past Surgical History  Procedure Laterality Date  . Joint replacement    . Hip arthroplasty Right   . Knee arthroplasty Bilateral   . 4 titanium posts for denture placement    . Hand arthrotomy Right     thumb surgery  . Cholecystectomy      "stones"- Laparoscopic  . Hammer toe surgery Left     and bunionectomy  . Cataract extraction Left   . Eus N/A 02/18/2013    Procedure: ESOPHAGEAL ENDOSCOPIC ULTRASOUND (EUS) RADIAL;  Surgeon: Arta Silence, MD;  Location: [facility];  Service: Endoscopy;  Laterality: N/A;  . Ercp N/A 02/18/2013    Procedure: ENDOSCOPIC RETROGRADE CHOLANGIOPANCREATOGRAPHY (ERCP);  Surgeon: Arta Silence, MD;  Location: [facility];  Service: Endoscopy;  Laterality: N/A;  . Transurethral resection of prostate    Had retained stone after lap chole then another stone in 2014.   Family History  Problem Relation Age of Onset  . Diabetes Mellitus II Neg Hx   . CAD Neg Hx    Social History:  reports that he quit smoking about 31 years ago. He does not have  any smokeless tobacco history on file. He reports that he drinks alcohol. He reports that he does not use illicit drugs.  Allergies:  Allergies  Allergen Reactions  . Arthrotec [Diclofenac-Misoprostol] Diarrhea  . Benazepril Hcl Other (See Comments)    Nasal congestion   . Doxazosin Other (See Comments)    Chest pain  . Meloxicam Diarrhea   Meds reviewed    No results found for this or any previous visit (from the past 48 hour(s)). Ct Abdomen Pelvis W Contrast  11/03/2013   CLINICAL DATA:  Right lower quadrant pain.  EXAM: CT ABDOMEN AND PELVIS WITH CONTRAST  TECHNIQUE: Multidetector CT imaging of the abdomen and pelvis was performed using the standard protocol following bolus administration of intravenous contrast.  CONTRAST:  170mL OMNIPAQUE IOHEXOL 300 MG/ML  SOLN  COMPARISON:  08/16/2008  FINDINGS: Appendix is distended to just under 13 mm with wall thickening and adjacent inflammation. There is no extraluminal air. There is no evidence of an abscess. The appendix extends posterior laterally and inferiorly from the cecal tip.  3 mm nodule in the lateral right lung base. Minor dependent subsegmental atelectasis. Heart is normal in size.  Small amount of biliary air lies in the left lobe of the liver. Liver otherwise  unremarkable. Normal spleen. The gallbladder is surgically absent. Mild chronic dilation of common bile duct with normal distal tapering. Normal pancreas. No adrenal masses.  There is renal cortical thinning. A 2.2 cm cyst arises from the lateral midpole the right kidney. No hydronephrosis. Normal ureters. Normal bladder.  No adenopathy.  No ascites.  There are multiple diverticula mostly along the sigmoid colon. The colon is otherwise unremarkable. Normal small bowel. There are degenerative changes noted of the visualized spine. Right hip prosthesis is well-seated and aligned. There left hip degenerative changes, mild.  IMPRESSION: 1. Acute appendicitis. No evidence of appendiceal  rupture or of a periappendiceal abscess. 2. No other acute findings. Critical Value/emergent results were called by telephone at the time of interpretation on 11/03/2013 at 7:19 PM to Dr. Jani Gravel , who verbally acknowledged these results.   Electronically Signed   By: Lajean Manes M.D.   On: 11/03/2013 19:19    Review of Systems  Constitutional: Positive for malaise/fatigue. Negative for fever and chills.  Cardiovascular: Negative for chest pain.  Gastrointestinal: Positive for nausea and abdominal pain. Negative for vomiting, diarrhea and constipation.  Genitourinary: Positive for urgency and frequency.    Blood pressure 133/77, pulse 82, temperature 98.3 F (36.8 C), temperature source Oral, resp. rate 20, height 6\' 4"  (1.93 m), weight 204 lb (92.534 kg), SpO2 98.00%. Physical Exam  Vitals reviewed. Constitutional: He appears well-developed and well-nourished.  Eyes: No scleral icterus.  Cardiovascular: Normal rate and regular rhythm.   Respiratory: Effort normal and breath sounds normal.  GI: Soft. Bowel sounds are normal. He exhibits no distension. There is tenderness in the right lower quadrant. There is tenderness at McBurney's point. No hernia.       Assessment/Plan Appendicitis  Discussed pathophysiology of disease. Discussed options and recommended laparoscopic appendectomy.  Risks including postop abscess, infection, bleeding, open procedures, damage to surrounding structures among others was reviewed.  Will place foley and leave overnight.  Plan to proceed asap  Rolm Bookbinder 11/03/2013, 8:20 PM

## 2013-11-03 NOTE — ED Provider Notes (Signed)
CSN: [id number]     Arrival date & time 11/03/13  1929 History   First MD Initiated Contact with Patient 11/03/13 2019     Chief Complaint  Patient presents with  . Abdominal Pain    dx with pancreatitis via CT today     (Consider location/radiation/quality/duration/timing/severity/associated sxs/prior Treatment) The history is provided by the patient. No language interpreter was used.  EMERIL STILLE is a 73 y/o M with past medical history of hypertension, anxiety, sleep apnea, kidney stones, neuromuscular disease, cancer, arthritis presenting to the ED with abdominal pain that started approximately 12:00 AM this morning localized to right lower quadrant. Reported that when resting the pain is described as a dull aching sensation, reported sharp discomfort with motion. Stated that the pain radiates towards the right side of the abdomen. Stated that he's been feeling nauseous with negative episodes of emesis. As per patient, reported that he was seen and assessed by his primary care provider, Dr. Maudie Mercury who recommended patient to receive a CT abdomen pelvis with contrast. CT abdomen pelvis with contrast performed with positive findings for acute appendicitis-patient was called to come to the emergency department immediately. Denied vomiting, diarrhea, melena, hematochezia, numbness, tingling, fever, chills, chest pain, short of breath, difficulty breathing. PCP Dr. Maudie Mercury  Past Medical History  Diagnosis Date  . Hypertension   . Anxiety   . Sleep apnea     no cpap used  . GERD (gastroesophageal reflux disease)   . History of kidney stones     x1 passed on own  . Neuromuscular disorder     restless leg syndrome  . Cancer     basal and squamous cell cancer lesion  . Arthritis     Osteoarthritis-left hip, s/p arthroplasty knees, right hip   Past Surgical History  Procedure Laterality Date  . Joint replacement    . Hip arthroplasty Right   . Knee arthroplasty Bilateral   . 4 titanium posts  for denture placement    . Hand arthrotomy Right     thumb surgery  . Cholecystectomy      "stones"- Laparoscopic  . Hammer toe surgery Left     and bunionectomy  . Cataract extraction Left   . Eus N/A 02/18/2013    Procedure: ESOPHAGEAL ENDOSCOPIC ULTRASOUND (EUS) RADIAL;  Surgeon: Arta Silence, MD;  Location: [facility];  Service: Endoscopy;  Laterality: N/A;  . Ercp N/A 02/18/2013    Procedure: ENDOSCOPIC RETROGRADE CHOLANGIOPANCREATOGRAPHY (ERCP);  Surgeon: Arta Silence, MD;  Location: [facility];  Service: Endoscopy;  Laterality: N/A;  . Transurethral resection of prostate     Family History  Problem Relation Age of Onset  . Diabetes Mellitus II Neg Hx   . CAD Neg Hx    History  Substance Use Topics  . Smoking status: Former Smoker -- 1.00 packs/day for 12 years    Quit date: 06/04/1982  . Smokeless tobacco: Not on file  . Alcohol Use: Yes     Comment: rare     Review of Systems  Constitutional: Negative for fever and chills.  Respiratory: Negative for chest tightness and shortness of breath.   Cardiovascular: Negative for chest pain.  Gastrointestinal: Positive for nausea and abdominal pain. Negative for vomiting, diarrhea, constipation, blood in stool and anal bleeding.      Allergies  Arthrotec; Benazepril hcl; Doxazosin; and Meloxicam  Home Medications   Prior to Admission medications   Medication Sig Start Date End Date Taking? Authorizing Provider  amLODipine (NORVASC) 2.5  MG tablet Take 2.5 mg by mouth every morning.    Historical Provider, MD  Carboxymethylcellulose Sodium 0.25 % SOLN Place 1-2 drops into both eyes daily as needed (For dry eyes).    Historical Provider, MD  celecoxib (CELEBREX) 200 MG capsule Take 200 mg by mouth 2 (two) times daily as needed for mild pain.    Historical Provider, MD  finasteride (PROSCAR) 5 MG tablet Take 5 mg by mouth daily.     Historical Provider, MD  fish oil-omega-3 fatty acids 1000 MG capsule Take 1 g by mouth  daily.    Historical Provider, MD  Multiple Vitamin (MULTIVITAMIN WITH MINERALS) TABS tablet Take 1 tablet by mouth daily.    Historical Provider, MD  omeprazole (PRILOSEC) 40 MG capsule Take 40 mg by mouth daily as needed (For acid reflex).     Historical Provider, MD  oxyCODONE-acetaminophen (PERCOCET/ROXICET) 5-325 MG per tablet Take 0.5-1 tablets by mouth every 4 (four) hours as needed for severe pain.    Historical Provider, MD  PRESCRIPTION MEDICATION Apply 1-2 g topically 2 (two) times daily as needed (neck shoulders and back). Baclofen 2%, cyclobenzaprine 2%, diclofenac 3%, gabapentin 6%, lidocaine 3%    Historical Provider, MD  senna (SENOKOT) 8.6 MG TABS tablet Take 1 tablet by mouth daily as needed for mild constipation.    Historical Provider, MD  sertraline (ZOLOFT) 100 MG tablet Take 100 mg by mouth daily.     Historical Provider, MD  tadalafil (CIALIS) 5 MG tablet Take 5 mg by mouth daily as needed for erectile dysfunction.    Historical Provider, MD  tamsulosin (FLOMAX) 0.4 MG CAPS capsule Take 0.4 mg by mouth daily.    Historical Provider, MD   BP 153/81  Pulse 82  Temp(Src) 98.8 F (37.1 C) (Oral)  Resp 17  Ht 6\' 4"  (1.93 m)  Wt 204 lb (92.534 kg)  BMI 24.84 kg/m2  SpO2 97% Physical Exam  Nursing note and vitals reviewed. Constitutional: He is oriented to person, place, and time. He appears well-developed and well-nourished. No distress.  HENT:  Head: Normocephalic and atraumatic.  Mouth/Throat: Oropharynx is clear and moist. No oropharyngeal exudate.  Eyes: Conjunctivae and EOM are normal. Pupils are equal, round, and reactive to light. Right eye exhibits no discharge. Left eye exhibits no discharge.  Neck: Normal range of motion. Neck supple. No tracheal deviation present.  Cardiovascular: Normal rate, regular rhythm and normal heart sounds.  Exam reveals no friction rub.   No murmur heard. Pulmonary/Chest: Effort normal and breath sounds normal. No respiratory  distress. He has no wheezes. He has no rales.  Abdominal: Soft. Bowel sounds are normal. He exhibits no distension. There is tenderness in the right lower quadrant. There is no rebound and no guarding.    Negative abdominal distention noted Bowel sounds are normal active in all 4 quadrants Abdomen soft upon palpation Discomfort upon palpation to the right lower quadrant Positive Rovsing sign Negative peritoneal signs Positive guarding upon palpation to the right lower quadrant  Musculoskeletal: Normal range of motion.  Lymphadenopathy:    He has no cervical adenopathy.  Neurological: He is alert and oriented to person, place, and time. No cranial nerve deficit. He exhibits normal muscle tone. Coordination normal.  Skin: Skin is warm and dry. No rash noted. He is not diaphoretic. No erythema.  Psychiatric: He has a normal mood and affect. His behavior is normal. Thought content normal.    ED Course  Procedures (including critical care time)  8:27 PM This provider spoke with Dr. Donne Hazel, general surgeon - as per surgeon, reported history placed orders, antibiotics-patient to go to surgery this evening.  Labs Review Labs Reviewed  CBC  BASIC METABOLIC PANEL  TYPE AND SCREEN    Imaging Review Ct Abdomen Pelvis W Contrast  11/03/2013   CLINICAL DATA:  Right lower quadrant pain.  EXAM: CT ABDOMEN AND PELVIS WITH CONTRAST  TECHNIQUE: Multidetector CT imaging of the abdomen and pelvis was performed using the standard protocol following bolus administration of intravenous contrast.  CONTRAST:  13mL OMNIPAQUE IOHEXOL 300 MG/ML  SOLN  COMPARISON:  08/16/2008  FINDINGS: Appendix is distended to just under 13 mm with wall thickening and adjacent inflammation. There is no extraluminal air. There is no evidence of an abscess. The appendix extends posterior laterally and inferiorly from the cecal tip.  3 mm nodule in the lateral right lung base. Minor dependent subsegmental atelectasis. Heart is  normal in size.  Small amount of biliary air lies in the left lobe of the liver. Liver otherwise unremarkable. Normal spleen. The gallbladder is surgically absent. Mild chronic dilation of common bile duct with normal distal tapering. Normal pancreas. No adrenal masses.  There is renal cortical thinning. A 2.2 cm cyst arises from the lateral midpole the right kidney. No hydronephrosis. Normal ureters. Normal bladder.  No adenopathy.  No ascites.  There are multiple diverticula mostly along the sigmoid colon. The colon is otherwise unremarkable. Normal small bowel. There are degenerative changes noted of the visualized spine. Right hip prosthesis is well-seated and aligned. There left hip degenerative changes, mild.  IMPRESSION: 1. Acute appendicitis. No evidence of appendiceal rupture or of a periappendiceal abscess. 2. No other acute findings. Critical Value/emergent results were called by telephone at the time of interpretation on 11/03/2013 at 7:19 PM to Dr. Jani Gravel , who verbally acknowledged these results.   Electronically Signed   By: Lajean Manes M.D.   On: 11/03/2013 19:19     EKG Interpretation None      MDM   Final diagnoses:  Acute appendicitis    Medications  ceFAZolin (ANCEF) IVPB 2 g/50 mL premix (not administered)  metroNIDAZOLE (FLAGYL) IVPB 500 mg (not administered)   Filed Vitals:   11/03/13 1944 11/03/13 2045  BP: 133/77 153/81  Pulse: 82   Temp: 98.3 F (36.8 C) 98.8 F (37.1 C)  TempSrc: Oral Oral  Resp: 20 17  Height: 6\' 4"  (1.93 m)   Weight: 204 lb (92.534 kg)   SpO2: 98% 97%   Patient presenting to the ED with acute appendicitis found on outside of hospital imaging-no evidence of appendiceal rupture or periappendiceal abscess. Basic labs ordered. Patient started on IV antibiotics while in ED setting. Patient stable, afebrile. Patient does not appear septic. Patient seen by general surgery, Dr. Kandis Mannan placed on n.p.o. since patient will be going to  surgery tonight. Discussed plan with patient agreed to plan of care and understood. Patient stable for transfer.   Jamse Mead, PA-C 11/04/13 1227

## 2013-11-04 MED ORDER — ACETAMINOPHEN 325 MG PO TABS: 650.0000 mg | ORAL_TABLET | Freq: Four times a day (QID) | ORAL | Status: DC | PRN

## 2013-11-04 MED ORDER — OXYCODONE-ACETAMINOPHEN 5-325 MG PO TABS: 1.0000 | ORAL_TABLET | Freq: Four times a day (QID) | ORAL | Status: DC | PRN

## 2013-11-04 NOTE — Progress Notes (Signed)
Patient verbalized understanding of discharge instructions. Patient assessment has not changed from am. Patient is stable at discharge.  

## 2013-11-04 NOTE — Discharge Summary (Signed)
Physician Discharge Summary  Patient ID: Austin Webb MRN: [id number] DOB/AGE: 74-Aug-1942 74 y.o.  Admit date: 11/03/2013 Discharge date: 11/04/2013  Admitting Diagnosis:  Acute appendicitis Umbilical hernia S/p TURP 10/03/13 with urinary retention and hospitalization 5/11-5/12/15 Hypertension Sleep Apnea Restless leg syndrome Arthritis hips and knees's Sleep Apnea GERD    Discharge Diagnosis;   Same   Patient Active Problem List   Diagnosis Date Noted  . S/P laparoscopic appendectomy 11/03/2013  . HTN (hypertension) 10/13/2013  . Anemia 10/13/2013  . S/P TURP 10/13/2013  . Bladder outlet obstruction 10/13/2013    Procedures" Laparoscopic appendectomy, Primary incisional umbilical hernia repair with suture  11/03/2013, Rolm Bookbinder, MD.    Imaging: Ct Abdomen Pelvis W Contrast  11/03/2013   CLINICAL DATA:  Right lower quadrant pain.  EXAM: CT ABDOMEN AND PELVIS WITH CONTRAST  TECHNIQUE: Multidetector CT imaging of the abdomen and pelvis was performed using the standard protocol following bolus administration of intravenous contrast.  CONTRAST:  15mL OMNIPAQUE IOHEXOL 300 MG/ML  SOLN  COMPARISON:  08/16/2008  FINDINGS: Appendix is distended to just under 13 mm with wall thickening and adjacent inflammation. There is no extraluminal air. There is no evidence of an abscess. The appendix extends posterior laterally and inferiorly from the cecal tip.  3 mm nodule in the lateral right lung base. Minor dependent subsegmental atelectasis. Heart is normal in size.  Small amount of biliary air lies in the left lobe of the liver. Liver otherwise unremarkable. Normal spleen. The gallbladder is surgically absent. Mild chronic dilation of common bile duct with normal distal tapering. Normal pancreas. No adrenal masses.  There is renal cortical thinning. A 2.2 cm cyst arises from the lateral midpole the right kidney. No hydronephrosis. Normal ureters. Normal bladder.  No adenopathy.  No  ascites.  There are multiple diverticula mostly along the sigmoid colon. The colon is otherwise unremarkable. Normal small bowel. There are degenerative changes noted of the visualized spine. Right hip prosthesis is well-seated and aligned. There left hip degenerative changes, mild.  IMPRESSION: 1. Acute appendicitis. No evidence of appendiceal rupture or of a periappendiceal abscess. 2. No other acute findings. Critical Value/emergent results were called by telephone at the time of interpretation on 11/03/2013 at 7:19 PM to Dr. Jani Gravel , who verbally acknowledged these results.   Electronically Signed   By: Lajean Manes M.D.   On: 11/03/2013 19:19     Hospital Course:   50 yom who has recent history of a TURP complicated by clot retention and foley placement. Has been doing well since mid May. In last 24 hours he has developed significant rlq pain that has worsened. Worse with movement and laughter. He has not had any relief. This has been progressive. Anorectic. No fever, no emesis, some nausea. Has history of colonoscopy he says have been fine. He was sent for outpt ct and found to have appendicitis.  He was seen in the ED by Dr. Donne Hazel and admitted with acute appendicitis.  He was taken to the OR that evening and underwent appendectomy and umbilical hernia repair without any complicating issues.  Foley was left in place overnight and removed the following AM.  He was doing well, his diet was advanced, he was mobilized and ready for discharge home after lunch.  He was scheduled for post op follow up as noted below.  Condition on d/c:  Improved.      Medication List         amLODipine 2.5 MG tablet  Commonly known as:  NORVASC  Take 2.5 mg by mouth every morning.     aspirin EC 81 MG tablet  Take 81 mg by mouth daily.     Carboxymethylcellulose Sodium 0.25 % Soln  Place 1-2 drops into both eyes daily as needed (For dry eyes).     celecoxib 200 MG capsule  Commonly known as:  CELEBREX   Take 200 mg by mouth 2 (two) times daily as needed for mild pain.     finasteride 5 MG tablet  Commonly known as:  PROSCAR  Take 5 mg by mouth daily.     fish oil-omega-3 fatty acids 1000 MG capsule  Take 1 g by mouth daily.     GONAK 2.5 % ophthalmic solution  Generic drug:  hydroxypropyl methylcellulose  Apply 1 drop to eye daily.     multivitamin with minerals Tabs tablet  Take 1 tablet by mouth daily.     omeprazole 40 MG capsule  Commonly known as:  PRILOSEC  Take 40 mg by mouth daily as needed (For acid reflex).     oxyCODONE-acetaminophen 5-325 MG per tablet  Commonly known as:  PERCOCET/ROXICET  Take 1-2 tablets by mouth every 6 (six) hours as needed for moderate pain.     PRESCRIPTION MEDICATION  Apply 1-2 g topically 2 (two) times daily as needed (neck shoulders and back). Baclofen 2%, cyclobenzaprine 2%, diclofenac 3%, gabapentin 6%, lidocaine 3%     senna 8.6 MG Tabs tablet  Commonly known as:  SENOKOT  Take 1 tablet by mouth daily as needed for mild constipation.     sertraline 100 MG tablet  Commonly known as:  ZOLOFT  Take 100 mg by mouth daily.     tadalafil 5 MG tablet  Commonly known as:  CIALIS  Take 5 mg by mouth daily as needed for erectile dysfunction.     tamsulosin 0.4 MG Caps capsule  Commonly known as:  FLOMAX  Take 0.4 mg by mouth daily.             Follow-up Information   Follow up with [facility]. (For post-operation check. Your appointment is at 2:45pm, please arrive at least 30 min before your appointment to complete your check in paperwork.  If you are unable to arrive 30 min prior to your appointment time we may have to cancel or reschedule you)    Contact information:   [place] [address] [phone]       Signed: [facility] [phone]  11/04/2013, 7:22 AM  Agree with above.  Alphonsa Overall, MD, [facility] Pager:  [phone] Office phone:  [phone]

## 2013-11-04 NOTE — Discharge Instructions (Addendum)
Your appointment is at 2:45pm, please arrive at least 30 min before your appointment to complete your check in paperwork.  If you are unable to arrive 30 min prior to your appointment time we may have to cancel or reschedule you.  [facility] LAPAROSCOPIC SURGERY: POST OP INSTRUCTIONS Always review your discharge instruction sheet given to you by the facility where your surgery was performed. IF YOU HAVE DISABILITY OR FAMILY LEAVE FORMS, YOU MUST BRING THEM TO THE OFFICE FOR PROCESSING.   DO NOT GIVE THEM TO YOUR DOCTOR.  1. A prescription for pain medication may be given to you upon discharge.  Take your pain medication as prescribed, if needed.  If narcotic pain medicine is not needed, then you may take acetaminophen (Tylenol) or ibuprofen (Advil) as needed. 2. Take your usually prescribed medications unless otherwise directed. 3. If you need a refill on your pain medication, please contact your pharmacy.  They will contact our office to request authorization. Prescriptions will not be filled after 5pm or on week-ends. 4. You should follow a light diet the first few days after arrival home, such as soup and crackers, etc.  Be sure to include lots of fluids daily. 5. Most patients will experience some swelling and bruising in the area of the incisions.  Ice packs will help.  Swelling and bruising can take several days to resolve.  6. It is common to experience some constipation if taking pain medication after surgery.  Increasing fluid intake and taking a stool softener (such as Colace) will usually help or prevent this problem from occurring.  A mild laxative (Milk of Magnesia or Miralax) should be taken according to package instructions if there are no bowel movements after 48 hours. 7. Unless discharge instructions indicate otherwise, you may remove your bandages 24-48 hours after surgery, and you may shower at that time.  You may have steri-strips (small skin tapes) in  place directly over the incision.  These strips should be left on the skin for 7-10 days.  If your surgeon used skin glue on the incision, you may shower in 24 hours.  The glue will flake off over the next 2-3 weeks.  Any sutures or staples will be removed at the office during your follow-up visit. 8. ACTIVITIES:  You may resume regular (light) daily activities beginning the next day--such as daily self-care, walking, climbing stairs--gradually increasing activities as tolerated.  You may have sexual intercourse when it is comfortable.  Refrain from any heavy lifting or straining until approved by your doctor. a. You may drive when you are no longer taking prescription pain medication, you can comfortably wear a seatbelt, and you can safely maneuver your car and apply brakes. b. RETURN TO WORK:  __________________________________________________________ 9. You should see your doctor in the office for a follow-up appointment approximately 2-3 weeks after your surgery.  Make sure that you call for this appointment within a day or two after you arrive home to insure a convenient appointment time. 10. OTHER INSTRUCTIONS: __________________________________________________________________________________________________________________________ __________________________________________________________________________________________________________________________ WHEN TO CALL YOUR DOCTOR: 1. Fever over 101.0 2. Inability to urinate 3. Continued bleeding from incision. 4. Increased pain, redness, or drainage from the incision. 5. Increasing abdominal pain  The clinic staff is available to answer your questions during regular business hours.  Please dont hesitate to call and ask to speak to one of the nurses for clinical concerns.  If you have a medical emergency, go to the nearest emergency room or call 911.  A surgeon from [facility] is always on call at the hospital. [address] [place], [place], [place]  [address] ? [address], [place], [place]   [address] [phone] ? [phone] ? FAX [phone] Web site: [email or web address]  UMBILICAL OR INGUINAL HERNIA REPAIR: POST OP INSTRUCTIONS  Always review your discharge instruction sheet given to you by the facility where your surgery was performed. IF YOU HAVE DISABILITY OR FAMILY LEAVE FORMS, YOU MUST BRING THEM TO THE OFFICE FOR PROCESSING.   DO NOT GIVE THEM TO YOUR DOCTOR.  11. A  prescription for pain medication may be given to you upon discharge.  Take your pain medication as prescribed, if needed.  If narcotic pain medicine is not needed, then you may take acetaminophen (Tylenol) or ibuprofen (Advil) as needed. 12. Take your usually prescribed medications unless otherwise directed. 13. If you need a refill on your pain medication, please contact your pharmacy.  They will contact our office to request authorization. Prescriptions will not be filled after 5 pm or on week-ends. 14. You should follow a light diet the first 24 hours after arrival home, such as soup and crackers, etc.  Be sure to include lots of fluids daily.  Resume your normal diet the day after surgery. 15. Most patients will experience some swelling and bruising around the umbilicus or in the groin and scrotum.  Ice packs and reclining will help.  Swelling and bruising can take several days to resolve.  16. It is common to experience some constipation if taking pain medication after surgery.  Increasing fluid intake and taking a stool softener (such as Colace) will usually help or prevent this problem from occurring.  A mild laxative (Milk of Magnesia or Miralax) should be taken according to package directions if there are no bowel movements after 48 hours. 17. Unless discharge instructions indicate otherwise, you may remove your bandages 24-48 hours after surgery, and you may shower at that  time.  You may have steri-strips (small skin tapes) in place directly over the incision.  These strips should be left on the skin for 7-10 days.  If your surgeon used skin glue on the incision, you may shower in 24 hours.  The glue will flake off over the next 2-3 weeks.  Any sutures or staples will be removed at the office during your follow-up visit. 18. ACTIVITIES:  You may resume regular (light) daily activities beginning the next day--such as daily self-care, walking, climbing stairs--gradually increasing activities as tolerated.  You may have sexual intercourse when it is comfortable.  Refrain from any heavy lifting or straining until approved by your doctor. a. You may drive when you are no longer taking prescription pain medication, you can comfortably wear a seatbelt, and you can safely maneuver your car and apply brakes. b. RETURN TO WORK:  __________________________________________________________ 19. You should see your doctor in the office for a follow-up appointment approximately 2-3 weeks after your surgery.  Make sure that you call for this appointment within a day or two after you arrive home to insure a convenient appointment time. 20. OTHER INSTRUCTIONS:  __________________________________________________________________________________________________________________________________________________________________________________________  WHEN TO CALL YOUR DOCTOR: 6. Fever over 101.0 7. Inability to urinate 8. Nausea and/or vomiting 9. Extreme swelling or bruising 10. Continued bleeding from incision. 11. Increased pain, redness, or drainage from the incision  The clinic staff is available to answer your questions during regular business hours.  Please dont hesitate to call  and ask to speak to one of the nurses for clinical concerns.  If you have a medical emergency, go to the nearest emergency room or call 911.  A surgeon from [facility] is always on call at the  hospital   [address], [place], [place], [place]  [address] ?  [address], [place], [place]   [address] [phone] ? [phone] ? FAX [phone] Web site: [email or web address]

## 2013-11-04 NOTE — Anesthesia Postprocedure Evaluation (Signed)
  Anesthesia Post-op Note  Patient: Austin Webb  Procedure(s) Performed: Procedure(s) (LRB): APPENDECTOMY LAPAROSCOPIC/ PRIMARY INCISIONAL HERNIA REPAIR (N/A)  Patient Location: PACU  Anesthesia Type: General  Level of Consciousness: awake and alert   Airway and Oxygen Therapy: Patient Spontanous Breathing  Post-op Pain: mild  Post-op Assessment: Post-op Vital signs reviewed, Patient's Cardiovascular Status Stable, Respiratory Function Stable, Patent Airway and No signs of Nausea or vomiting  Last Vitals:  Filed Vitals:   11/03/13 2330  BP: 127/80  Pulse: 86  Temp: 37.3 C  Resp: 16    Post-op Vital Signs: stable   Complications: No apparent anesthesia complications

## 2013-11-04 NOTE — Progress Notes (Signed)
1 Day Post-Op  Subjective: Feels good, incisions look fine.  He did well with clears and is ready to get up and moving.  Objective: Vital signs in last 24 hours: Temp:  [97.9 F (36.6 C)-99.7 F (37.6 C)] 97.9 F (36.6 C) (06/03 0615) Pulse Rate:  [78-106] 78 (06/03 0615) Resp:  [16-20] 18 (06/03 0615) BP: (127-165)/(77-93) 143/81 mmHg (06/03 0615) SpO2:  [95 %-99 %] 95 % (06/03 0615) Weight:  [92.534 kg (204 lb)] 92.534 kg (204 lb) (06/02 1944)  Clear diet Afebrile, VSS No labs Intake/Output from previous day: 06/02 0701 - 06/03 0700 In: 2228.8 [P.O.:240; I.V.:1988.8] Out: 2675 [Urine:2675] Intake/Output this shift: Total I/O In: -  Out: 200 [Urine:200]  General appearance: alert, cooperative and no distress Resp: clear to auscultation bilaterally GI: soft sore, ports OK, few BS.  Lab Results:   Recent Labs  11/03/13 2037  WBC 15.0*  HGB 13.0  HCT 38.4*  PLT 187    BMET  Recent Labs  11/03/13 2037  NA 136*  K 3.8  CL 98  CO2 24  GLUCOSE 108*  BUN 16  CREATININE 0.96  CALCIUM 9.3   PT/INR No results found for this basename: LABPROT, INR,  in the last 72 hours  No results found for this basename: AST, ALT, ALKPHOS, BILITOT, PROT, ALBUMIN,  in the last 168 hours   Lipase  No results found for this basename: lipase     Studies/Results: Ct Abdomen Pelvis W Contrast  11/03/2013   CLINICAL DATA:  Right lower quadrant pain.  EXAM: CT ABDOMEN AND PELVIS WITH CONTRAST  TECHNIQUE: Multidetector CT imaging of the abdomen and pelvis was performed using the standard protocol following bolus administration of intravenous contrast.  CONTRAST:  153mL OMNIPAQUE IOHEXOL 300 MG/ML  SOLN  COMPARISON:  08/16/2008  FINDINGS: Appendix is distended to just under 13 mm with wall thickening and adjacent inflammation. There is no extraluminal air. There is no evidence of an abscess. The appendix extends posterior laterally and inferiorly from the cecal tip.  3 mm nodule in the  lateral right lung base. Minor dependent subsegmental atelectasis. Heart is normal in size.  Small amount of biliary air lies in the left lobe of the liver. Liver otherwise unremarkable. Normal spleen. The gallbladder is surgically absent. Mild chronic dilation of common bile duct with normal distal tapering. Normal pancreas. No adrenal masses.  There is renal cortical thinning. A 2.2 cm cyst arises from the lateral midpole the right kidney. No hydronephrosis. Normal ureters. Normal bladder.  No adenopathy.  No ascites.  There are multiple diverticula mostly along the sigmoid colon. The colon is otherwise unremarkable. Normal small bowel. There are degenerative changes noted of the visualized spine. Right hip prosthesis is well-seated and aligned. There left hip degenerative changes, mild.  IMPRESSION: 1. Acute appendicitis. No evidence of appendiceal rupture or of a periappendiceal abscess. 2. No other acute findings. Critical Value/emergent results were called by telephone at the time of interpretation on 11/03/2013 at 7:19 PM to Dr. Jani Gravel , who verbally acknowledged these results.   Electronically Signed   By: Lajean Manes M.D.   On: 11/03/2013 19:19    Medications: . amLODipine  2.5 mg Oral q morning - 10a  .  ceFAZolin (ANCEF) IV  2 g Intravenous 3 times per day  . finasteride  5 mg Oral Daily  . heparin  5,000 Units Subcutaneous 3 times per day  . metronidazole  500 mg Intravenous Q8H  . pantoprazole  40 mg Oral Daily  . sertraline  100 mg Oral Daily  . tamsulosin  0.4 mg Oral Daily    Assessment/Plan Laparoscopic appendectomy, Primary incisional umbilical hernia repair with suture 11/03/2013, Rolm Bookbinder, MD. Urinary retention Hypertension Sleep Apnea GERD Restless leg syndrome Arthritis with Hip and knee isues   Plan:  He is doing well, I am going to get him up and ambulate him, advance his diet.  If he does well he can go home after lunch.  LOS: 1 day    Earnstine Regal 11/04/2013  Agree with above.  Alphonsa Overall, MD, [facility] Surgery Pager: [phone] Office phone:  [phone]

## 2013-11-05 ENCOUNTER — Encounter (HOSPITAL_COMMUNITY): Payer: Self-pay | Admitting: General Surgery

## 2013-11-05 NOTE — ED Provider Notes (Signed)
Medical screening examination/treatment/procedure(s) were performed by non-physician practitioner and as supervising physician I was immediately available for consultation/collaboration.   EKG Interpretation None        Merryl Hacker, MD 11/05/13 [phone]

## 2013-11-24 ENCOUNTER — Ambulatory Visit (INDEPENDENT_AMBULATORY_CARE_PROVIDER_SITE_OTHER): Payer: Medicare Other | Admitting: General Surgery

## 2013-11-24 ENCOUNTER — Encounter (INDEPENDENT_AMBULATORY_CARE_PROVIDER_SITE_OTHER): Payer: Self-pay | Admitting: General Surgery

## 2013-11-24 VITALS — BP 112/54 | HR 72 | Temp 98.2°F | Resp 14 | Ht 72.0 in | Wt 209.8 lb

## 2013-11-24 DIAGNOSIS — K3580 Unspecified acute appendicitis: Secondary | ICD-10-CM

## 2013-11-24 NOTE — Progress Notes (Signed)
Austin Webb [birth year] [id number] 11/24/2013   History of Present Illness: Austin Webb is a  73 y.o. male who presents today status post lap appy by Dr. Rolm Bookbinder.  Pathology reveals acute suppurative appendicitis.  The patient is tolerating a regular diet, having normal bowel movements, has good pain control.  He  is back to most normal activities.   Physical Exam: Abd: soft, nontender, active bowel sounds, nondistended.  All incisions are well healed.  Impression: 1.  Acute appendicitis, s/p lap appy  Plan: He  is able to return to normal activities. He  may follow up on a prn basis.

## 2013-11-24 NOTE — Patient Instructions (Signed)
Follow up as needed

## 2014-06-03 ENCOUNTER — Ambulatory Visit
Admission: RE | Admit: 2014-06-03 | Discharge: 2014-06-03 | Disposition: A | Discharge status: Home / Self Care | Payer: Medicare Other | Source: Ambulatory Visit | Attending: Physician Assistant | Admitting: Physician Assistant

## 2014-06-03 ENCOUNTER — Other Ambulatory Visit: Payer: Self-pay | Admitting: Pain Medicine

## 2014-06-03 ENCOUNTER — Other Ambulatory Visit: Payer: Self-pay | Admitting: Physician Assistant

## 2014-06-03 DIAGNOSIS — M545 Low back pain: Secondary | ICD-10-CM

## 2014-06-24 ENCOUNTER — Other Ambulatory Visit: Payer: Self-pay | Admitting: Pain Medicine

## 2014-06-24 DIAGNOSIS — M5410 Radiculopathy, site unspecified: Secondary | ICD-10-CM

## 2014-07-04 ENCOUNTER — Ambulatory Visit
Admission: RE | Admit: 2014-07-04 | Discharge: 2014-07-04 | Disposition: A | Discharge status: Home / Self Care | Payer: Medicare Other | Source: Ambulatory Visit | Attending: Pain Medicine | Admitting: Pain Medicine

## 2014-07-04 DIAGNOSIS — M5410 Radiculopathy, site unspecified: Secondary | ICD-10-CM

## 2014-11-29 ENCOUNTER — Other Ambulatory Visit: Payer: Self-pay

## 2015-08-17 ENCOUNTER — Encounter: Payer: Self-pay | Admitting: *Deleted

## 2015-08-23 ENCOUNTER — Encounter: Payer: Self-pay | Admitting: *Deleted

## 2015-08-23 ENCOUNTER — Ambulatory Visit: Payer: PPO | Admitting: Anesthesiology

## 2015-08-23 ENCOUNTER — Encounter
Admission: RE | Disposition: A | Discharge status: Home / Self Care | Payer: Self-pay | Source: Ambulatory Visit | Attending: Ophthalmology

## 2015-08-23 ENCOUNTER — Ambulatory Visit
Admission: RE | Admit: 2015-08-23 | Discharge: 2015-08-23 | Disposition: A | Discharge status: Home / Self Care | Payer: PPO | Source: Ambulatory Visit | Attending: Ophthalmology | Admitting: Ophthalmology

## 2015-08-23 DIAGNOSIS — G4730 Sleep apnea, unspecified: Secondary | ICD-10-CM | POA: Diagnosis not present

## 2015-08-23 DIAGNOSIS — Z87891 Personal history of nicotine dependence: Secondary | ICD-10-CM | POA: Diagnosis not present

## 2015-08-23 DIAGNOSIS — F329 Major depressive disorder, single episode, unspecified: Secondary | ICD-10-CM | POA: Diagnosis not present

## 2015-08-23 DIAGNOSIS — I10 Essential (primary) hypertension: Secondary | ICD-10-CM | POA: Insufficient documentation

## 2015-08-23 DIAGNOSIS — Z96641 Presence of right artificial hip joint: Secondary | ICD-10-CM | POA: Insufficient documentation

## 2015-08-23 DIAGNOSIS — F419 Anxiety disorder, unspecified: Secondary | ICD-10-CM | POA: Diagnosis not present

## 2015-08-23 DIAGNOSIS — E669 Obesity, unspecified: Secondary | ICD-10-CM | POA: Insufficient documentation

## 2015-08-23 DIAGNOSIS — Z79899 Other long term (current) drug therapy: Secondary | ICD-10-CM | POA: Insufficient documentation

## 2015-08-23 DIAGNOSIS — Z85828 Personal history of other malignant neoplasm of skin: Secondary | ICD-10-CM | POA: Diagnosis not present

## 2015-08-23 DIAGNOSIS — Z6828 Body mass index (BMI) 28.0-28.9, adult: Secondary | ICD-10-CM | POA: Insufficient documentation

## 2015-08-23 DIAGNOSIS — H2511 Age-related nuclear cataract, right eye: Secondary | ICD-10-CM | POA: Diagnosis present

## 2015-08-23 DIAGNOSIS — K219 Gastro-esophageal reflux disease without esophagitis: Secondary | ICD-10-CM | POA: Insufficient documentation

## 2015-08-23 DIAGNOSIS — Z96653 Presence of artificial knee joint, bilateral: Secondary | ICD-10-CM | POA: Diagnosis not present

## 2015-08-23 HISTORY — PX: CATARACT EXTRACTION W/PHACO: SHX586

## 2015-08-23 HISTORY — DX: Major depressive disorder, single episode, unspecified: F32.9

## 2015-08-23 HISTORY — DX: Depression, unspecified: F32.A

## 2015-08-23 SURGERY — PHACOEMULSIFICATION, CATARACT, WITH IOL INSERTION
Anesthesia: Monitor Anesthesia Care | Site: Eye | Laterality: Right | Wound class: Clean

## 2015-08-23 MED ORDER — CARBACHOL 0.01 % IO SOLN
INTRAOCULAR | Status: DC | PRN
  Administered 2015-08-23: .5 mL via INTRAOCULAR

## 2015-08-23 MED ORDER — NA CHONDROIT SULF-NA HYALURON 40-17 MG/ML IO SOLN
INTRAOCULAR | Status: AC
  Filled 2015-08-23: qty 1

## 2015-08-23 MED ORDER — MOXIFLOXACIN HCL 0.5 % OP SOLN
OPHTHALMIC | Status: AC
  Filled 2015-08-23: qty 3

## 2015-08-23 MED ORDER — FENTANYL CITRATE (PF) 100 MCG/2ML IJ SOLN
INTRAMUSCULAR | Status: DC | PRN
  Administered 2015-08-23: 50 ug via INTRAVENOUS

## 2015-08-23 MED ORDER — POVIDONE-IODINE 5 % OP SOLN
OPHTHALMIC | Status: AC
  Administered 2015-08-23: 1 via OPHTHALMIC
  Filled 2015-08-23: qty 30

## 2015-08-23 MED ORDER — MOXIFLOXACIN HCL 0.5 % OP SOLN
OPHTHALMIC | Status: DC | PRN
  Administered 2015-08-23: 1 [drp] via OPHTHALMIC

## 2015-08-23 MED ORDER — ARMC OPHTHALMIC DILATING GEL
1.0000 "application " | OPHTHALMIC | Status: DC | PRN
  Administered 2015-08-23: 1 via OPHTHALMIC

## 2015-08-23 MED ORDER — TETRACAINE HCL 0.5 % OP SOLN
1.0000 [drp] | OPHTHALMIC | Status: AC | PRN
  Administered 2015-08-23: 1 [drp] via OPHTHALMIC

## 2015-08-23 MED ORDER — MOXIFLOXACIN HCL 0.5 % OP SOLN: 1.0000 [drp] | OPHTHALMIC | Status: DC | PRN

## 2015-08-23 MED ORDER — EPINEPHRINE HCL 1 MG/ML IJ SOLN
INTRAMUSCULAR | Status: AC
  Filled 2015-08-23: qty 1

## 2015-08-23 MED ORDER — POVIDONE-IODINE 5 % OP SOLN
1.0000 "application " | OPHTHALMIC | Status: AC | PRN
  Administered 2015-08-23: 1 via OPHTHALMIC

## 2015-08-23 MED ORDER — SODIUM CHLORIDE 0.9 % IV SOLN
INTRAVENOUS | Status: DC
  Administered 2015-08-23: 50 mL/h via INTRAVENOUS

## 2015-08-23 MED ORDER — GLYCOPYRROLATE 0.2 MG/ML IJ SOLN
INTRAMUSCULAR | Status: DC | PRN
  Administered 2015-08-23: 0.2 mg via INTRAVENOUS

## 2015-08-23 MED ORDER — TETRACAINE HCL 0.5 % OP SOLN
OPHTHALMIC | Status: AC
  Administered 2015-08-23: 1 [drp] via OPHTHALMIC
  Filled 2015-08-23: qty 2

## 2015-08-23 MED ORDER — CEFUROXIME OPHTHALMIC INJECTION 1 MG/0.1 ML
INJECTION | OPHTHALMIC | Status: AC
  Filled 2015-08-23: qty 0.1

## 2015-08-23 MED ORDER — EPINEPHRINE HCL 1 MG/ML IJ SOLN
INTRAOCULAR | Status: DC | PRN
  Administered 2015-08-23: 1 mL via OPHTHALMIC

## 2015-08-23 MED ORDER — MIDAZOLAM HCL 2 MG/2ML IJ SOLN
INTRAMUSCULAR | Status: DC | PRN
Start: 2015-08-23 — End: 2015-08-23
  Administered 2015-08-23: 1 mg via INTRAVENOUS

## 2015-08-23 MED ORDER — CEFUROXIME OPHTHALMIC INJECTION 1 MG/0.1 ML
INJECTION | OPHTHALMIC | Status: DC | PRN
  Administered 2015-08-23: .1 mL via INTRACAMERAL

## 2015-08-23 MED ORDER — NA CHONDROIT SULF-NA HYALURON 40-17 MG/ML IO SOLN
INTRAOCULAR | Status: DC | PRN
  Administered 2015-08-23: 1 mL via INTRAOCULAR

## 2015-08-23 MED ORDER — ARMC OPHTHALMIC DILATING GEL
OPHTHALMIC | Status: AC
  Administered 2015-08-23: 1 via OPHTHALMIC
  Filled 2015-08-23: qty 0.25

## 2015-08-23 SURGICAL SUPPLY — 23 items
CANNULA ANT/CHMB 27G (MISCELLANEOUS) ×1 IMPLANT
CANNULA ANT/CHMB 27GA (MISCELLANEOUS) ×2 IMPLANT
CUP MEDICINE 2OZ PLAST GRAD ST (MISCELLANEOUS) ×2 IMPLANT
GLOVE BIO SURGEON STRL SZ8 (GLOVE) ×2 IMPLANT
GLOVE BIOGEL M 6.5 STRL (GLOVE) ×2 IMPLANT
GLOVE SURG LX 8.0 MICRO (GLOVE) ×1
GLOVE SURG LX STRL 8.0 MICRO (GLOVE) ×1 IMPLANT
GOWN STRL REUS W/ TWL LRG LVL3 (GOWN DISPOSABLE) ×2 IMPLANT
GOWN STRL REUS W/TWL LRG LVL3 (GOWN DISPOSABLE) ×4
LENS IOL TECNIS 20.5 (Intraocular Lens) ×2 IMPLANT
LENS IOL TECNIS MONO 1P 20.5 (Intraocular Lens) ×1 IMPLANT
PACK CATARACT (MISCELLANEOUS) ×2 IMPLANT
PACK CATARACT BRASINGTON LX (MISCELLANEOUS) ×2 IMPLANT
PACK EYE AFTER SURG (MISCELLANEOUS) ×2 IMPLANT
SOL BSS BAG (MISCELLANEOUS) ×2
SOL PREP PVP 2OZ (MISCELLANEOUS) ×2
SOLUTION BSS BAG (MISCELLANEOUS) ×1 IMPLANT
SOLUTION PREP PVP 2OZ (MISCELLANEOUS) ×1 IMPLANT
SYR 3ML LL SCALE MARK (SYRINGE) ×2 IMPLANT
SYR 5ML LL (SYRINGE) ×2 IMPLANT
SYR TB 1ML 27GX1/2 LL (SYRINGE) ×2 IMPLANT
WATER STERILE IRR 1000ML POUR (IV SOLUTION) ×2 IMPLANT
WIPE NON LINTING 3.25X3.25 (MISCELLANEOUS) ×2 IMPLANT

## 2015-08-23 NOTE — Anesthesia Postprocedure Evaluation (Signed)
Anesthesia Post Note  Patient: Austin Webb  Procedure(s) Performed: Procedure(s) (LRB): CATARACT EXTRACTION PHACO AND INTRAOCULAR LENS PLACEMENT (IOC) (Right)  Patient location during evaluation: Short Stay Anesthesia Type: MAC Level of consciousness: awake and alert and oriented Pain management: pain level controlled Vital Signs Assessment: post-procedure vital signs reviewed and stable Respiratory status: spontaneous breathing Cardiovascular status: stable Anesthetic complications: no    Last Vitals:  Filed Vitals:   08/23/15 0852 08/23/15 0904  BP: 128/85 145/81  Pulse: 55 67  Temp: 35.8 C   Resp: 20 18    Last Pain: There were no vitals filed for this visit.               Lanora Manis

## 2015-08-23 NOTE — Anesthesia Preprocedure Evaluation (Signed)
Anesthesia Evaluation  Patient identified by MRN, date of birth, ID band Patient awake    Reviewed: Allergy & Precautions, NPO status , Patient's Chart, lab work & pertinent test results  Airway Mallampati: III       Dental  (+) Implants, Upper Dentures   Pulmonary sleep apnea , former smoker,    breath sounds clear to auscultation       Cardiovascular Exercise Tolerance: Good hypertension, Pt. on medications  Rhythm:Regular     Neuro/Psych Anxiety Depression    GI/Hepatic Neg liver ROS, GERD  Medicated,  Endo/Other  negative endocrine ROS  Renal/GU negative Renal ROS     Musculoskeletal   Abdominal (+) + obese,   Peds  Hematology  (+) anemia ,   Anesthesia Other Findings   Reproductive/Obstetrics                             Anesthesia Physical Anesthesia Plan  ASA: II  Anesthesia Plan: MAC   Post-op Pain Management:    Induction: Intravenous  Airway Management Planned: Natural Airway and Nasal Cannula  Additional Equipment:   Intra-op Plan:   Post-operative Plan:   Informed Consent: I have reviewed the patients History and Physical, chart, labs and discussed the procedure including the risks, benefits and alternatives for the proposed anesthesia with the patient or authorized representative who has indicated his/her understanding and acceptance.     Plan Discussed with: CRNA  Anesthesia Plan Comments:         Anesthesia Quick Evaluation

## 2015-08-23 NOTE — Op Note (Signed)
PREOPERATIVE DIAGNOSIS:  Nuclear sclerotic cataract of the right eye.   POSTOPERATIVE DIAGNOSIS: nuclear sclerotic cataract right eye   OPERATIVE PROCEDURE:  Procedure(s): CATARACT EXTRACTION PHACO AND INTRAOCULAR LENS PLACEMENT (IOC)   SURGEON:  Birder Robson, MD.   ANESTHESIA:  Anesthesiologist: Gijsbertus Lonia Mad, MD CRNA: Jonna Clark, CRNA  1.      Managed anesthesia care. 2.      Topical tetracaine drops followed by 2% Xylocaine jelly applied in the preoperative holding area.   COMPLICATIONS:  None.   TECHNIQUE:   Stop and chop   DESCRIPTION OF PROCEDURE:  The patient was examined and consented in the preoperative holding area where the aforementioned topical anesthesia was applied to the right eye and then brought back to the Operating Room where the right eye was prepped and draped in the usual sterile ophthalmic fashion and a lid speculum was placed. A paracentesis was created with the side port blade and the anterior chamber was filled with viscoelastic. A near clear corneal incision was performed with the steel keratome. A continuous curvilinear capsulorrhexis was performed with a cystotome followed by the capsulorrhexis forceps. Hydrodissection and hydrodelineation were carried out with BSS on a blunt cannula. The lens was removed in a stop and chop  technique and the remaining cortical material was removed with the irrigation-aspiration handpiece. The capsular bag was inflated with viscoelastic and the Technis [id number]  lens was placed in the capsular bag without complication. The remaining viscoelastic was removed from the eye with the irrigation-aspiration handpiece. The wounds were hydrated. The anterior chamber was flushed with Miostat and the eye was inflated to physiologic pressure. 0.1 mL of cefuroxime concentration 10 mg/mL was placed in the anterior chamber. The wounds were found to be water tight. The eye was dressed with Vigamox. The patient was given  protective glasses to wear throughout the day and a shield with which to sleep tonight. The patient was also given drops with which to begin a drop regimen today and will follow-up with me in one day.  Implant Name Type Inv. Item Serial No. Manufacturer Lot No. LRB No. Used  LENS IOL TECNIS 20.5 - [id number] Intraocular Lens LENS IOL TECNIS 20.5 [id number] AMO [id number] Right 1   Procedure(s) with comments: CATARACT EXTRACTION PHACO AND INTRAOCULAR LENS PLACEMENT (IOC) (Right) - [place] 1:00 AP% 35.2 CDE 10.11 fluid pack lot # [id number]  Electronically signed: [place] 08/23/2015 8:51 AM

## 2015-08-23 NOTE — Discharge Instructions (Signed)
FOLLOW EYE DROP INSTRUCTION SHEET AS REVIEWED   Eye Surgery Discharge Instructions  Expect mild scratchy sensation or mild soreness. DO NOT RUB YOUR EYE!  The day of surgery:  Minimal physical activity, but bed rest is not required  No reading, computer work, or close hand work  No bending, lifting, or straining.  May watch TV  For 24 hours:  No driving, legal decisions, or alcoholic beverages  Safety precautions  Eat anything you prefer: It is better to start with liquids, then soup then solid foods.  _____ Eye patch should be worn until postoperative exam tomorrow.  ____ Solar shield eyeglasses should be worn for comfort in the sunlight/patch while sleeping  Resume all regular medications including aspirin or Coumadin if these were discontinued prior to surgery. You may shower, bathe, shave, or wash your hair. Tylenol may be taken for mild discomfort.  Call your doctor if you experience significant pain, nausea, or vomiting, fever > 101 or other signs of infection. [phone] or [phone] Specific instructions:  Follow-up Information    Follow up with Tim Lair, MD.   Specialty:  Ophthalmology   Why:  Wed 08/24/15 @ 10:05 am   Contact information:   [address] [place] [address] [phone]

## 2015-08-23 NOTE — H&P (Signed)
All labs reviewed. Abnormal studies sent to patients PCP when indicated.  Previous H&P reviewed, patient examined, there are NO CHANGES.  Austin Webb LOUIS3/21/20178:25 AM

## 2015-08-23 NOTE — Transfer of Care (Signed)
Immediate Anesthesia Transfer of Care Note  Patient: Austin Webb  Procedure(s) Performed: Procedure(s) with comments: CATARACT EXTRACTION PHACO AND INTRAOCULAR LENS PLACEMENT (IOC) (Right) - [place] 1:00 AP% 35.2 CDE 10.11 fluid pack lot # [id number]  Patient Location: PACU and Short Stay  Anesthesia Type:MAC  Level of Consciousness: awake, alert  and oriented  Airway & Oxygen Therapy: Patient Spontanous Breathing  Post-op Assessment: Report given to RN  Post vital signs: Reviewed and stable  Last Vitals:  Filed Vitals:   08/23/15 0851 08/23/15 0852  BP: 128/85 128/85  Pulse: 66 55  Temp: 36.3 C 35.8 C  Resp: 16 20    Complications: No apparent anesthesia complications

## 2016-06-15 DIAGNOSIS — L680 Hirsutism: Secondary | ICD-10-CM | POA: Diagnosis not present

## 2016-07-31 DIAGNOSIS — E291 Testicular hypofunction: Secondary | ICD-10-CM | POA: Diagnosis not present

## 2016-09-13 DIAGNOSIS — M109 Gout, unspecified: Secondary | ICD-10-CM | POA: Diagnosis not present

## 2016-09-13 DIAGNOSIS — Z125 Encounter for screening for malignant neoplasm of prostate: Secondary | ICD-10-CM | POA: Diagnosis not present

## 2016-09-13 DIAGNOSIS — I10 Essential (primary) hypertension: Secondary | ICD-10-CM | POA: Diagnosis not present

## 2016-09-20 DIAGNOSIS — M109 Gout, unspecified: Secondary | ICD-10-CM | POA: Diagnosis not present

## 2016-09-20 DIAGNOSIS — E7800 Pure hypercholesterolemia, unspecified: Secondary | ICD-10-CM | POA: Diagnosis not present

## 2016-09-20 DIAGNOSIS — I10 Essential (primary) hypertension: Secondary | ICD-10-CM | POA: Diagnosis not present

## 2016-09-20 DIAGNOSIS — Z0000 Encounter for general adult medical examination without abnormal findings: Secondary | ICD-10-CM | POA: Diagnosis not present

## 2016-10-16 DIAGNOSIS — Z96641 Presence of right artificial hip joint: Secondary | ICD-10-CM | POA: Diagnosis not present

## 2016-10-16 DIAGNOSIS — Z96651 Presence of right artificial knee joint: Secondary | ICD-10-CM | POA: Diagnosis not present

## 2016-10-16 DIAGNOSIS — M25561 Pain in right knee: Secondary | ICD-10-CM | POA: Diagnosis not present

## 2016-10-16 DIAGNOSIS — M1712 Unilateral primary osteoarthritis, left knee: Secondary | ICD-10-CM | POA: Diagnosis not present

## 2016-10-16 DIAGNOSIS — Z96652 Presence of left artificial knee joint: Secondary | ICD-10-CM | POA: Diagnosis not present

## 2016-10-16 DIAGNOSIS — M25551 Pain in right hip: Secondary | ICD-10-CM | POA: Diagnosis not present

## 2016-11-29 DIAGNOSIS — R1013 Epigastric pain: Secondary | ICD-10-CM | POA: Diagnosis not present

## 2016-11-29 DIAGNOSIS — K4090 Unilateral inguinal hernia, without obstruction or gangrene, not specified as recurrent: Secondary | ICD-10-CM | POA: Diagnosis not present

## 2016-11-30 ENCOUNTER — Other Ambulatory Visit (HOSPITAL_COMMUNITY): Payer: Self-pay | Admitting: Internal Medicine

## 2016-11-30 DIAGNOSIS — R109 Unspecified abdominal pain: Secondary | ICD-10-CM

## 2016-12-04 ENCOUNTER — Ambulatory Visit (HOSPITAL_COMMUNITY)
Admission: RE | Admit: 2016-12-04 | Discharge: 2016-12-04 | Disposition: A | Discharge status: Home / Self Care | Payer: Medicare HMO | Source: Ambulatory Visit | Attending: Internal Medicine | Admitting: Internal Medicine

## 2016-12-04 DIAGNOSIS — K5730 Diverticulosis of large intestine without perforation or abscess without bleeding: Secondary | ICD-10-CM | POA: Insufficient documentation

## 2016-12-04 DIAGNOSIS — Z9049 Acquired absence of other specified parts of digestive tract: Secondary | ICD-10-CM | POA: Insufficient documentation

## 2016-12-04 DIAGNOSIS — K4090 Unilateral inguinal hernia, without obstruction or gangrene, not specified as recurrent: Secondary | ICD-10-CM | POA: Diagnosis not present

## 2016-12-04 DIAGNOSIS — I708 Atherosclerosis of other arteries: Secondary | ICD-10-CM | POA: Diagnosis not present

## 2016-12-04 DIAGNOSIS — R109 Unspecified abdominal pain: Secondary | ICD-10-CM | POA: Diagnosis not present

## 2016-12-04 MED ORDER — IOPAMIDOL (ISOVUE-300) INJECTION 61%
INTRAVENOUS | Status: AC
  Administered 2016-12-04: 100 mL via INTRAVENOUS
  Filled 2016-12-04: qty 100

## 2016-12-04 MED ORDER — IOPAMIDOL (ISOVUE-300) INJECTION 61%
100.0000 mL | Freq: Once | INTRAVENOUS | Status: AC | PRN
  Administered 2016-12-04: 100 mL via INTRAVENOUS

## 2016-12-06 DIAGNOSIS — K7689 Other specified diseases of liver: Secondary | ICD-10-CM | POA: Diagnosis not present

## 2016-12-06 DIAGNOSIS — K4090 Unilateral inguinal hernia, without obstruction or gangrene, not specified as recurrent: Secondary | ICD-10-CM | POA: Diagnosis not present

## 2016-12-31 ENCOUNTER — Ambulatory Visit: Payer: Self-pay | Admitting: Surgery

## 2016-12-31 DIAGNOSIS — N401 Enlarged prostate with lower urinary tract symptoms: Secondary | ICD-10-CM | POA: Diagnosis not present

## 2016-12-31 DIAGNOSIS — K4020 Bilateral inguinal hernia, without obstruction or gangrene, not specified as recurrent: Secondary | ICD-10-CM | POA: Diagnosis not present

## 2016-12-31 DIAGNOSIS — K7689 Other specified diseases of liver: Secondary | ICD-10-CM | POA: Diagnosis not present

## 2016-12-31 DIAGNOSIS — R350 Frequency of micturition: Secondary | ICD-10-CM | POA: Diagnosis not present

## 2016-12-31 DIAGNOSIS — Z01818 Encounter for other preprocedural examination: Secondary | ICD-10-CM | POA: Diagnosis not present

## 2016-12-31 NOTE — H&P (Signed)
Austin Webb 12/31/2016 8:43 AM Location: [facility] Patient #: [id number] DOB: Oct 03, 1940 Married / Language: English / Race: White Male  History of Present Illness Austin Hector MD; 12/31/2016 9:18 AM) The patient is a 76 year old male who presents with an inguinal hernia. Note for "Inguinal hernia": ` ` ` Patient sent for surgical consultation at the request of Dr. Jani Webb  Chief Complaint: Inguinal hernia  The patient is a 76 year old male. Had umbilical hernia repair and appendectomy to my partner 2015. Noted a swelling in right groin. Diagnosed by his primary care physician with a right inguinal hernia. Recent CAT scan showed bilateral inguinal hernias. Surgical consultation requested.   Patient comes in today with his wife. His noticeable increasing bulging the right side especially when he stands up. Occasionally uncomfortable. Wonders if somethings on the left as well. Goes away when he lies down. Rather physically active. Does a lot of yard work. Can walk a half hour without difficulty. Usually moves his bowels every morning. He is on aspirin but no other blood thinners. He's had a fair amount of orthopedic and other surgeries. A lot of joint surgery. Initially he wondered if he had recurrent right hip issues. Workup by his orthopedic surgeon, Dr. Chriss Webb was negative. Suspicion of hernia was raised. Suspicion verified by primary care physician. Therefore, he was sent to me. Had cholecystectomy and appendectomy in the past. No hernia surgeries. No history of infections. He does not smoke.    (Review of systems as stated in this history (HPI) or in the review of systems. Otherwise all other 12 point ROS are negative)   Allergies Austin Webb, CMA; 12/31/2016 8:44 AM) No Known Drug Allergies 12/31/2016 Allergies Reconciled  Medication History Austin Webb, CMA; 12/31/2016 8:46 AM) Sertraline HCl (100MG  Tablet, Oral)  Active. Allopurinol (100MG  Tablet, Oral) Active. Amlodipine Besylate-Valsartan (10-160MG  Tablet, Oral) Active. Fish Oil + D3 (1000-1000MG -UNIT Capsule, Oral) Active. Aspirin (81MG  Capsule, 1 (one) Oral) Active. Multiple Vitamin (1 (one) Oral) Active. Medications Reconciled    Vitals Austin Webb CMA; 12/31/2016 8:47 AM) 12/31/2016 8:46 AM Weight: 198 lb Height: 70in Body Surface Area: 2.08 m Body Mass Index: 28.41 kg/m  Temp.: 98.81F  Pulse: 61 (Regular)  BP: 112/90 (Sitting, Left Arm, Standard)      Physical Exam Austin Hector MD; 12/31/2016 9:14 AM)  General Mental Status-Alert. General Appearance-Not in acute distress, Not Sickly. Orientation-Oriented X3. Hydration-Well hydrated. Voice-Normal.  Integumentary Global Assessment Upon inspection and palpation of skin surfaces of the - Axillae: non-tender, no inflammation or ulceration, no drainage. and Distribution of scalp and body hair is normal. General Characteristics Temperature - normal warmth is noted.  Head and Neck Head-normocephalic, atraumatic with no lesions or palpable masses. Face Global Assessment - atraumatic, no absence of expression. Neck Global Assessment - no abnormal movements, no bruit auscultated on the right, no bruit auscultated on the left, no decreased range of motion, non-tender. Trachea-midline. Thyroid Gland Characteristics - non-tender.  Eye Eyeball - Left-Extraocular movements intact, No Nystagmus. Eyeball - Right-Extraocular movements intact, No Nystagmus. Cornea - Left-No Hazy. Cornea - Right-No Hazy. Sclera/Conjunctiva - Left-No scleral icterus, No Discharge. Sclera/Conjunctiva - Right-No scleral icterus, No Discharge. Pupil - Left-Direct reaction to light normal. Pupil - Right-Direct reaction to light normal.  ENMT Ears Pinna - Left - no drainage observed, no generalized tenderness observed. Right - no drainage observed,  no generalized tenderness observed. Nose and Sinuses External Inspection of the Nose - no destructive lesion observed. Inspection of  the nares - Left - quiet respiration. Right - quiet respiration. Mouth and Throat Lips - Upper Lip - no fissures observed, no pallor noted. Lower Lip - no fissures observed, no pallor noted. Nasopharynx - no discharge present. Oral Cavity/Oropharynx - Tongue - no dryness observed. Oral Mucosa - no cyanosis observed. Hypopharynx - no evidence of airway distress observed.  Chest and Lung Exam Inspection Movements - Normal and Symmetrical. Accessory muscles - No use of accessory muscles in breathing. Palpation Palpation of the chest reveals - Non-tender. Auscultation Breath sounds - Normal and Clear.  Cardiovascular Auscultation Rhythm - Regular. Murmurs & Other Heart Sounds - Auscultation of the heart reveals - No Murmurs and No Systolic Clicks.  Abdomen Inspection Inspection of the abdomen reveals - No Visible peristalsis and No Abnormal pulsations. Umbilicus - No Bleeding, No Urine drainage. Palpation/Percussion Palpation and Percussion of the abdomen reveal - Soft, Non Tender, No Rebound tenderness, No Rigidity (guarding) and No Cutaneous hyperesthesia. Note: Abdomen soft. Nontender, nondistended. No guarding. No diastasis. No umbilical nor other hernias  Male Genitourinary Sexual Maturity Tanner 5 - Adult hair pattern and Adult penile size and shape. Note: Obvious right groin swelling consistent with hernia. Reducible. Swelling of left groin on Valsalva consistent with left inguinal hernia as well. Uncircumcised male. normal external male genitalia otherwise.  Peripheral Vascular Upper Extremity Inspection - Left - No Cyanotic nailbeds, Not Ischemic. Right - No Cyanotic nailbeds, Not Ischemic.  Neurologic Neurologic evaluation reveals -normal attention span and ability to concentrate, able to name objects and repeat phrases. Appropriate fund of  knowledge , normal sensation and normal coordination. Mental Status Affect - not angry, not paranoid. Cranial Nerves-Normal Bilaterally. Gait-Normal.  Neuropsychiatric Mental status exam performed with findings of-able to articulate well with normal speech/language, rate, volume and coherence, thought content normal with ability to perform basic computations and apply abstract reasoning and no evidence of hallucinations, delusions, obsessions or homicidal/suicidal ideation.  Musculoskeletal Global Assessment Spine, Ribs and Pelvis - no instability, subluxation or laxity. Right Upper Extremity - no instability, subluxation or laxity.  Lymphatic Head & Neck  General Head & Neck Lymphatics: Bilateral - Description - No Localized lymphadenopathy. Axillary  General Axillary Region: Bilateral - Description - No Localized lymphadenopathy. Femoral & Inguinal  Generalized Femoral & Inguinal Lymphatics: Left - Description - No Localized lymphadenopathy. Right - Description - No Localized lymphadenopathy.    Assessment & Plan Austin Hector MD; 12/31/2016 9:16 AM)  BILATERAL INGUINAL HERNIA WITHOUT OBSTRUCTION OR GANGRENE, RECURRENCE NOT SPECIFIED (K40.20) Impression: Bilateral renal hernias. Right greater than left. I think you very good candidate for laparoscopic underlay repair. Had a long discussion with the patient and his wife. They wish to try and get it done soon.  Given his urinary issues, I think perioperative Flomax would be a good idea to lower the chance of urinary retention since he has bilateral hernias  PREOP - ING HERNIA - ENCOUNTER FOR PREOPERATIVE EXAMINATION FOR GENERAL SURGICAL PROCEDURE (Z01.818)  Current Plans You are being scheduled for surgery- Our schedulers will call you.  You should hear from our office's scheduling department within 5 working days about the location, date, and time of surgery. We try to make accommodations for patient's preferences in  scheduling surgery, but sometimes the OR schedule or the surgeon's schedule prevents [place] from making those accommodations.  If you have not heard from our office [phone]) in 5 working days, call the office and ask for your surgeon's nurse.  If you have other  questions about your diagnosis, plan, or surgery, call the office and ask for your surgeon's nurse.  Written instructions provided The anatomy & physiology of the abdominal wall and pelvic floor was discussed. The pathophysiology of hernias in the inguinal and pelvic region was discussed. Natural history risks such as progressive enlargement, pain, incarceration, and strangulation was discussed. Contributors to complications such as smoking, obesity, diabetes, prior surgery, etc were discussed.  I feel the risks of no intervention will lead to serious problems that outweigh the operative risks; therefore, I recommended surgery to reduce and repair the hernia. I explained laparoscopic techniques with possible need for an open approach. I noted usual use of mesh to patch and/or buttress hernia repair  Risks such as bleeding, infection, abscess, need for further treatment, heart attack, death, and other risks were discussed. I noted a good likelihood this will help address the problem. Goals of post-operative recovery were discussed as well. Possibility that this will not correct all symptoms was explained. I stressed the importance of low-impact activity, aggressive pain control, avoiding constipation, & not pushing through pain to minimize risk of post-operative chronic pain or injury. Possibility of reherniation was discussed. We will work to minimize complications.  An educational handout further explaining the pathology & treatment options was given as well. Questions were answered. The patient expresses understanding & wishes to proceed with surgery.  Pt Education - Pamphlet Given - Laparoscopic Hernia Repair: discussed  with patient and provided information. Pt Education - [facility] Pain Control (Yacob Wilkerson) Pt Education - [facility] Hernia Post-Op HCI (Candyce Gambino): discussed with patient and provided information. 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 Amalia Hailey at [facility].  His risk of urinary retention preoperatively is increased. Give perioperative Flomax to help lower that chance. One week before. One week after.  Current Plans Started Flomax 0.4MG , 1 (one) Capsule daily, #14, 14 days starting 12/31/2016, Ref. x2. Local Order: to shrink the prostate and help with urination  Austin Webb, M.D., F.A.C.S. Gastrointestinal and Minimally Invasive Surgery [facility] [address], [place], [place] [address] [phone] Main / Paging

## 2017-01-02 DIAGNOSIS — L820 Inflamed seborrheic keratosis: Secondary | ICD-10-CM | POA: Diagnosis not present

## 2017-01-02 DIAGNOSIS — L570 Actinic keratosis: Secondary | ICD-10-CM | POA: Diagnosis not present

## 2017-01-02 DIAGNOSIS — D485 Neoplasm of uncertain behavior of skin: Secondary | ICD-10-CM | POA: Diagnosis not present

## 2017-01-02 DIAGNOSIS — Z85828 Personal history of other malignant neoplasm of skin: Secondary | ICD-10-CM | POA: Diagnosis not present

## 2017-01-02 DIAGNOSIS — C44219 Basal cell carcinoma of skin of left ear and external auricular canal: Secondary | ICD-10-CM | POA: Diagnosis not present

## 2017-01-02 DIAGNOSIS — L821 Other seborrheic keratosis: Secondary | ICD-10-CM | POA: Diagnosis not present

## 2017-01-02 DIAGNOSIS — D692 Other nonthrombocytopenic purpura: Secondary | ICD-10-CM | POA: Diagnosis not present

## 2017-01-04 DIAGNOSIS — K4090 Unilateral inguinal hernia, without obstruction or gangrene, not specified as recurrent: Secondary | ICD-10-CM | POA: Diagnosis not present

## 2017-01-04 DIAGNOSIS — N400 Enlarged prostate without lower urinary tract symptoms: Secondary | ICD-10-CM | POA: Diagnosis not present

## 2017-01-04 DIAGNOSIS — M109 Gout, unspecified: Secondary | ICD-10-CM | POA: Diagnosis not present

## 2017-01-04 DIAGNOSIS — I10 Essential (primary) hypertension: Secondary | ICD-10-CM | POA: Diagnosis not present

## 2017-01-23 NOTE — Patient Instructions (Addendum)
Austin Webb  01/23/2017   Your procedure is scheduled on: 01-31-17   Report to [facility]  elevators to 3rd floor to  [place] at 9:00 AM.   Call this number if you have problems the morning of surgery [phone]    Remember: ONLY 1 PERSON MAY GO WITH YOU TO SHORT STAY TO GET  READY MORNING OF [place].  Do not eat food or drink liquids :After Midnight.     Take these medicines the morning of surgery with A SIP OF WATER: Amlodipine (Norvasc), Pantoprazole (Protonix), and Tamsulosin (Flomax)                                You may not have any metal on your body including hair pins and              piercings  Do not wear jewelry, make-up, lotions, powders or perfumes, deodorant             Men may shave face and neck.   Do not bring valuables to the hospital. [place].  Contacts, dentures or bridgework may not be worn into surgery.      Patients discharged the day of surgery will not be allowed to drive home.  Name and phone number of your driver: Austin Webb [phone]               Please read over the following fact sheets you were given: _____________________________________________________________________             [facility] - Preparing for Surgery Before surgery, you can play an important role.  Because skin is not sterile, your skin needs to be as free of germs as possible.  You can reduce the number of germs on your skin by washing with CHG (chlorahexidine gluconate) soap before surgery.  CHG is an antiseptic cleaner which kills germs and bonds with the skin to continue killing germs even after washing. Please DO NOT use if you have an allergy to CHG or antibacterial soaps.  If your skin becomes reddened/irritated stop using the CHG and inform your nurse when you arrive at Short Stay. Do not shave (including legs and underarms) for at least 48 hours  prior to the first CHG shower.  You may shave your face/neck. Please follow these instructions carefully:  1.  Shower with CHG Soap the night before surgery and the  morning of Surgery.  2.  If you choose to wash your hair, wash your hair first as usual with your  normal  shampoo.  3.  After you shampoo, rinse your hair and body thoroughly to remove the  shampoo.                           4.  Use CHG as you would any other liquid soap.  You can apply chg directly  to the skin and wash                       Gently with a scrungie or clean washcloth.  5.  Apply the CHG Soap to your body ONLY FROM THE NECK DOWN.  Do not use on face/ open                           Wound or open sores. Avoid contact with eyes, ears mouth and genitals (private parts).                       Wash face,  Genitals (private parts) with your normal soap.             6.  Wash thoroughly, paying special attention to the area where your surgery  will be performed.  7.  Thoroughly rinse your body with warm water from the neck down.  8.  DO NOT shower/wash with your normal soap after using and rinsing off  the CHG Soap.                9.  Pat yourself dry with a clean towel.            10.  Wear clean pajamas.            11.  Place clean sheets on your bed the night of your first shower and do not  sleep with pets. Day of Surgery : Do not apply any lotions/deodorants the morning of surgery.  Please wear clean clothes to the hospital/surgery center.  FAILURE TO FOLLOW THESE INSTRUCTIONS MAY RESULT IN THE CANCELLATION OF YOUR SURGERY PATIENT SIGNATURE_________________________________  NURSE SIGNATURE__________________________________  ________________________________________________________________________

## 2017-01-23 NOTE — Progress Notes (Signed)
01-04-17 Surgical clearance from Dr. Maudie Mercury on chart

## 2017-01-24 ENCOUNTER — Encounter (HOSPITAL_COMMUNITY)
Admission: RE | Admit: 2017-01-24 | Discharge: 2017-01-24 | Disposition: A | Discharge status: Home / Self Care | Payer: Medicare HMO | Source: Ambulatory Visit | Attending: Surgery | Admitting: Surgery

## 2017-01-24 ENCOUNTER — Encounter (HOSPITAL_COMMUNITY): Payer: Self-pay

## 2017-01-24 DIAGNOSIS — Z01812 Encounter for preprocedural laboratory examination: Secondary | ICD-10-CM | POA: Diagnosis not present

## 2017-01-24 DIAGNOSIS — I10 Essential (primary) hypertension: Secondary | ICD-10-CM | POA: Insufficient documentation

## 2017-01-24 DIAGNOSIS — Z01810 Encounter for preprocedural cardiovascular examination: Secondary | ICD-10-CM | POA: Insufficient documentation

## 2017-01-24 DIAGNOSIS — K4020 Bilateral inguinal hernia, without obstruction or gangrene, not specified as recurrent: Secondary | ICD-10-CM | POA: Insufficient documentation

## 2017-01-24 DIAGNOSIS — R001 Bradycardia, unspecified: Secondary | ICD-10-CM | POA: Diagnosis not present

## 2017-01-24 LAB — BASIC METABOLIC PANEL
Anion gap: 8 (ref 5–15)
BUN: 18 mg/dL (ref 6–20)
CHLORIDE: 105 mmol/L (ref 101–111)
CO2: 26 mmol/L (ref 22–32)
Calcium: 9.5 mg/dL (ref 8.9–10.3)
Creatinine, Ser: 0.95 mg/dL (ref 0.61–1.24)
GFR calc Af Amer: >60 mL/min (ref >60)
GFR calc non Af Amer: >60 mL/min (ref >60)
GLUCOSE: 106 mg/dL — AB (ref 65–99)
POTASSIUM: 4.5 mmol/L (ref 3.5–5.1)
Sodium: 139 mmol/L (ref 135–145)

## 2017-01-24 LAB — CBC
HEMATOCRIT: 47 % (ref 39.0–52.0)
HEMOGLOBIN: 16.6 g/dL (ref 13.0–17.0)
MCH: 31.2 pg (ref 26.0–34.0)
MCHC: 35.3 g/dL (ref 30.0–36.0)
MCV: 88.3 fL (ref 78.0–100.0)
Platelets: 166 10*3/uL (ref 150–400)
RBC: 5.32 MIL/uL (ref 4.22–5.81)
RDW: 13 % (ref 11.5–15.5)
WBC: 7.7 10*3/uL (ref 4.0–10.5)

## 2017-01-31 ENCOUNTER — Ambulatory Visit (HOSPITAL_COMMUNITY): Payer: Medicare HMO | Admitting: Certified Registered"

## 2017-01-31 ENCOUNTER — Encounter (HOSPITAL_COMMUNITY)
Admission: RE | Disposition: A | Discharge status: Home / Self Care | Payer: Self-pay | Source: Ambulatory Visit | Attending: Surgery

## 2017-01-31 ENCOUNTER — Observation Stay (HOSPITAL_COMMUNITY)
Admission: RE | Admit: 2017-01-31 | Discharge: 2017-02-02 | Disposition: A | Discharge status: Home / Self Care | Payer: Medicare HMO | Source: Ambulatory Visit | Attending: Surgery | Admitting: Surgery

## 2017-01-31 ENCOUNTER — Encounter (HOSPITAL_COMMUNITY): Payer: Self-pay | Admitting: *Deleted

## 2017-01-31 DIAGNOSIS — N400 Enlarged prostate without lower urinary tract symptoms: Secondary | ICD-10-CM | POA: Insufficient documentation

## 2017-01-31 DIAGNOSIS — K219 Gastro-esophageal reflux disease without esophagitis: Secondary | ICD-10-CM | POA: Insufficient documentation

## 2017-01-31 DIAGNOSIS — Z79899 Other long term (current) drug therapy: Secondary | ICD-10-CM | POA: Diagnosis not present

## 2017-01-31 DIAGNOSIS — R1114 Bilious vomiting: Secondary | ICD-10-CM

## 2017-01-31 DIAGNOSIS — G2581 Restless legs syndrome: Secondary | ICD-10-CM | POA: Insufficient documentation

## 2017-01-31 DIAGNOSIS — Z87891 Personal history of nicotine dependence: Secondary | ICD-10-CM | POA: Diagnosis not present

## 2017-01-31 DIAGNOSIS — N320 Bladder-neck obstruction: Secondary | ICD-10-CM | POA: Diagnosis not present

## 2017-01-31 DIAGNOSIS — Z7982 Long term (current) use of aspirin: Secondary | ICD-10-CM | POA: Insufficient documentation

## 2017-01-31 DIAGNOSIS — R1110 Vomiting, unspecified: Secondary | ICD-10-CM

## 2017-01-31 DIAGNOSIS — K429 Umbilical hernia without obstruction or gangrene: Secondary | ICD-10-CM | POA: Diagnosis not present

## 2017-01-31 DIAGNOSIS — D649 Anemia, unspecified: Secondary | ICD-10-CM | POA: Diagnosis not present

## 2017-01-31 DIAGNOSIS — K4020 Bilateral inguinal hernia, without obstruction or gangrene, not specified as recurrent: Secondary | ICD-10-CM | POA: Diagnosis not present

## 2017-01-31 DIAGNOSIS — K4090 Unilateral inguinal hernia, without obstruction or gangrene, not specified as recurrent: Secondary | ICD-10-CM

## 2017-01-31 DIAGNOSIS — R39198 Other difficulties with micturition: Secondary | ICD-10-CM

## 2017-01-31 DIAGNOSIS — R42 Dizziness and giddiness: Secondary | ICD-10-CM

## 2017-01-31 DIAGNOSIS — I10 Essential (primary) hypertension: Secondary | ICD-10-CM | POA: Diagnosis not present

## 2017-01-31 DIAGNOSIS — Z9049 Acquired absence of other specified parts of digestive tract: Secondary | ICD-10-CM | POA: Insufficient documentation

## 2017-01-31 HISTORY — PX: INGUINAL HERNIA REPAIR: SHX194

## 2017-01-31 SURGERY — REPAIR, HERNIA, INGUINAL, BILATERAL, LAPAROSCOPIC
Anesthesia: General | Site: Abdomen

## 2017-01-31 MED ORDER — FENTANYL CITRATE (PF) 100 MCG/2ML IJ SOLN
INTRAMUSCULAR | Status: DC | PRN
  Administered 2017-01-31 (×4): 25 ug via INTRAVENOUS

## 2017-01-31 MED ORDER — MIDAZOLAM HCL 2 MG/2ML IJ SOLN
INTRAMUSCULAR | Status: DC | PRN
  Administered 2017-01-31: 2 mg via INTRAVENOUS

## 2017-01-31 MED ORDER — MIDAZOLAM HCL 2 MG/2ML IJ SOLN
INTRAMUSCULAR | Status: AC
  Filled 2017-01-31: qty 2

## 2017-01-31 MED ORDER — EPHEDRINE SULFATE 50 MG/ML IJ SOLN
INTRAMUSCULAR | Status: DC | PRN
  Administered 2017-01-31: 5 mg via INTRAVENOUS
  Administered 2017-01-31 (×3): 10 mg via INTRAVENOUS

## 2017-01-31 MED ORDER — TRAMADOL HCL 50 MG PO TABS: 50.0000 mg | ORAL_TABLET | Freq: Four times a day (QID) | ORAL | 0 refills | Status: DC | PRN

## 2017-01-31 MED ORDER — CHLORHEXIDINE GLUCONATE CLOTH 2 % EX PADS: 6.0000 | MEDICATED_PAD | Freq: Once | CUTANEOUS | Status: DC

## 2017-01-31 MED ORDER — FENTANYL CITRATE (PF) 100 MCG/2ML IJ SOLN
INTRAMUSCULAR | Status: AC
  Filled 2017-01-31: qty 2

## 2017-01-31 MED ORDER — DIPHENHYDRAMINE HCL 12.5 MG/5ML PO ELIX
12.5000 mg | ORAL_SOLUTION | Freq: Four times a day (QID) | ORAL | Status: DC | PRN
  Filled 2017-01-31: qty 5

## 2017-01-31 MED ORDER — DEXAMETHASONE SODIUM PHOSPHATE 10 MG/ML IJ SOLN
INTRAMUSCULAR | Status: DC | PRN
Start: 2017-01-31 — End: 2017-01-31
  Administered 2017-01-31: 10 mg via INTRAVENOUS

## 2017-01-31 MED ORDER — TRAMADOL HCL 50 MG PO TABS
50.0000 mg | ORAL_TABLET | Freq: Four times a day (QID) | ORAL | Status: DC | PRN
  Administered 2017-01-31: 50 mg via ORAL
  Filled 2017-01-31 (×2): qty 1

## 2017-01-31 MED ORDER — BUPIVACAINE-EPINEPHRINE 0.25% -1:200000 IJ SOLN
INTRAMUSCULAR | Status: DC | PRN
  Administered 2017-01-31: 80 mL

## 2017-01-31 MED ORDER — ACETAMINOPHEN 500 MG PO TABS
ORAL_TABLET | ORAL | Status: AC
  Filled 2017-01-31: qty 2

## 2017-01-31 MED ORDER — SODIUM CHLORIDE 0.9 % IV SOLN
INTRAVENOUS | Status: DC
  Administered 2017-01-31 (×2): via INTRAVENOUS

## 2017-01-31 MED ORDER — SERTRALINE HCL 100 MG PO TABS
100.0000 mg | ORAL_TABLET | Freq: Every day | ORAL | Status: DC
  Administered 2017-01-31 – 2017-02-01 (×2): 100 mg via ORAL
  Filled 2017-01-31 (×2): qty 1

## 2017-01-31 MED ORDER — LACTATED RINGERS IR SOLN
Status: DC | PRN
  Administered 2017-01-31: 1000 mL

## 2017-01-31 MED ORDER — BUPIVACAINE-EPINEPHRINE (PF) 0.25% -1:200000 IJ SOLN
INTRAMUSCULAR | Status: AC
  Filled 2017-01-31: qty 30

## 2017-01-31 MED ORDER — ONDANSETRON HCL 4 MG/2ML IJ SOLN
4.0000 mg | Freq: Four times a day (QID) | INTRAMUSCULAR | Status: DC | PRN
  Administered 2017-02-01: 4 mg via INTRAVENOUS
  Filled 2017-01-31: qty 2

## 2017-01-31 MED ORDER — PROPOFOL 10 MG/ML IV BOLUS
INTRAVENOUS | Status: DC | PRN
  Administered 2017-01-31: 200 mg via INTRAVENOUS

## 2017-01-31 MED ORDER — MORPHINE SULFATE (PF) 4 MG/ML IV SOLN: 2.0000 mg | INTRAVENOUS | Status: DC | PRN

## 2017-01-31 MED ORDER — ROCURONIUM BROMIDE 100 MG/10ML IV SOLN
INTRAVENOUS | Status: DC | PRN
  Administered 2017-01-31: 50 mg via INTRAVENOUS

## 2017-01-31 MED ORDER — DOCUSATE SODIUM 100 MG PO CAPS
100.0000 mg | ORAL_CAPSULE | Freq: Two times a day (BID) | ORAL | Status: DC
  Administered 2017-01-31: 100 mg via ORAL
  Filled 2017-01-31: qty 1

## 2017-01-31 MED ORDER — DIPHENHYDRAMINE HCL 50 MG/ML IJ SOLN: 12.5000 mg | Freq: Four times a day (QID) | INTRAMUSCULAR | Status: DC | PRN

## 2017-01-31 MED ORDER — LIDOCAINE HCL (CARDIAC) 20 MG/ML IV SOLN
INTRAVENOUS | Status: DC | PRN
  Administered 2017-01-31: 100 mg via INTRATRACHEAL

## 2017-01-31 MED ORDER — GLYCOPYRROLATE 0.2 MG/ML IJ SOLN
INTRAMUSCULAR | Status: DC | PRN
  Administered 2017-01-31: 0.2 mg via INTRAVENOUS

## 2017-01-31 MED ORDER — CEFAZOLIN SODIUM-DEXTROSE 2-4 GM/100ML-% IV SOLN
2.0000 g | INTRAVENOUS | Status: AC
  Administered 2017-01-31: 2 g via INTRAVENOUS

## 2017-01-31 MED ORDER — ONDANSETRON HCL 4 MG/2ML IJ SOLN
INTRAMUSCULAR | Status: DC | PRN
  Administered 2017-01-31: 4 mg via INTRAVENOUS

## 2017-01-31 MED ORDER — PANTOPRAZOLE SODIUM 40 MG PO TBEC
40.0000 mg | DELAYED_RELEASE_TABLET | Freq: Every day | ORAL | Status: DC
  Filled 2017-01-31: qty 1

## 2017-01-31 MED ORDER — CEFAZOLIN SODIUM-DEXTROSE 2-4 GM/100ML-% IV SOLN
INTRAVENOUS | Status: AC
  Filled 2017-01-31: qty 100

## 2017-01-31 MED ORDER — SODIUM CHLORIDE 0.9 % IV BOLUS (SEPSIS)
500.0000 mL | Freq: Once | INTRAVENOUS | Status: AC
  Administered 2017-01-31: 500 mL via INTRAVENOUS

## 2017-01-31 MED ORDER — TRAMADOL HCL 50 MG PO TABS: 50.0000 mg | ORAL_TABLET | Freq: Four times a day (QID) | ORAL | Status: DC | PRN

## 2017-01-31 MED ORDER — PROPOFOL 10 MG/ML IV BOLUS
INTRAVENOUS | Status: AC
  Filled 2017-01-31: qty 40

## 2017-01-31 MED ORDER — FENTANYL CITRATE (PF) 100 MCG/2ML IJ SOLN
25.0000 ug | INTRAMUSCULAR | Status: DC | PRN
  Administered 2017-01-31 (×2): 50 ug via INTRAVENOUS

## 2017-01-31 MED ORDER — GABAPENTIN 300 MG PO CAPS
300.0000 mg | ORAL_CAPSULE | Freq: Two times a day (BID) | ORAL | Status: DC
  Administered 2017-01-31 – 2017-02-02 (×4): 300 mg via ORAL
  Filled 2017-01-31 (×4): qty 1

## 2017-01-31 MED ORDER — FENTANYL CITRATE (PF) 100 MCG/2ML IJ SOLN
INTRAMUSCULAR | Status: AC
  Filled 2017-01-31: qty 4

## 2017-01-31 MED ORDER — SUGAMMADEX SODIUM 200 MG/2ML IV SOLN
INTRAVENOUS | Status: DC | PRN
  Administered 2017-01-31: 200 mg via INTRAVENOUS

## 2017-01-31 MED ORDER — TAMSULOSIN HCL 0.4 MG PO CAPS
0.4000 mg | ORAL_CAPSULE | Freq: Every day | ORAL | Status: DC
  Administered 2017-02-01 – 2017-02-02 (×2): 0.4 mg via ORAL
  Filled 2017-01-31 (×3): qty 1

## 2017-01-31 MED ORDER — ACETAMINOPHEN 500 MG PO TABS
1000.0000 mg | ORAL_TABLET | ORAL | Status: AC
  Administered 2017-01-31: 1000 mg via ORAL
  Filled 2017-01-31: qty 2

## 2017-01-31 MED ORDER — ACETAMINOPHEN 500 MG PO TABS
1000.0000 mg | ORAL_TABLET | Freq: Four times a day (QID) | ORAL | Status: DC
  Administered 2017-01-31 – 2017-02-01 (×3): 1000 mg via ORAL
  Filled 2017-01-31 (×2): qty 2

## 2017-01-31 MED ORDER — BUPIVACAINE-EPINEPHRINE 0.25% -1:200000 IJ SOLN
INTRAMUSCULAR | Status: AC
  Filled 2017-01-31: qty 2

## 2017-01-31 MED ORDER — GABAPENTIN 300 MG PO CAPS
300.0000 mg | ORAL_CAPSULE | ORAL | Status: AC
  Administered 2017-01-31: 300 mg via ORAL
  Filled 2017-01-31: qty 1

## 2017-01-31 MED ORDER — 0.9 % SODIUM CHLORIDE (POUR BTL) OPTIME
TOPICAL | Status: DC | PRN
  Administered 2017-01-31: 1000 mL

## 2017-01-31 MED ORDER — LACTATED RINGERS IV SOLN
INTRAVENOUS | Status: DC
  Administered 2017-01-31 (×2): via INTRAVENOUS

## 2017-01-31 MED ORDER — ONDANSETRON 4 MG PO TBDP: 4.0000 mg | ORAL_TABLET | Freq: Four times a day (QID) | ORAL | Status: DC | PRN

## 2017-01-31 MED ORDER — ENOXAPARIN SODIUM 40 MG/0.4ML ~~LOC~~ SOLN
40.0000 mg | SUBCUTANEOUS | Status: DC
  Administered 2017-02-01 – 2017-02-02 (×2): 40 mg via SUBCUTANEOUS
  Filled 2017-01-31 (×3): qty 0.4

## 2017-01-31 SURGICAL SUPPLY — 35 items
CABLE HIGH FREQUENCY MONO STRZ (ELECTRODE) ×2 IMPLANT
CHLORAPREP W/TINT 26ML (MISCELLANEOUS) ×2 IMPLANT
COVER SURGICAL LIGHT HANDLE (MISCELLANEOUS) ×2 IMPLANT
DECANTER SPIKE VIAL GLASS SM (MISCELLANEOUS) ×2 IMPLANT
DEVICE SECURE STRAP 25 ABSORB (INSTRUMENTS) IMPLANT
DRAPE WARM FLUID 44X44 (DRAPE) ×2 IMPLANT
DRSG TEGADERM 2-3/8X2-3/4 SM (GAUZE/BANDAGES/DRESSINGS) ×3 IMPLANT
DRSG TEGADERM 4X4.75 (GAUZE/BANDAGES/DRESSINGS) ×2 IMPLANT
ELECT REM PT RETURN 15FT ADLT (MISCELLANEOUS) ×2 IMPLANT
GAUZE SPONGE 2X2 8PLY STRL LF (GAUZE/BANDAGES/DRESSINGS) ×1 IMPLANT
GLOVE ECLIPSE 8.0 STRL XLNG CF (GLOVE) ×2 IMPLANT
GLOVE INDICATOR 8.0 STRL GRN (GLOVE) ×2 IMPLANT
GOWN STRL REUS W/TWL XL LVL3 (GOWN DISPOSABLE) ×5 IMPLANT
IRRIG SUCT STRYKERFLOW 2 WTIP (MISCELLANEOUS) ×2
IRRIGATION SUCT STRKRFLW 2 WTP (MISCELLANEOUS) ×1 IMPLANT
KIT BASIN OR (CUSTOM PROCEDURE TRAY) ×2 IMPLANT
MESH ULTRAPRO 6X6 15CM15CM (Mesh General) ×3 IMPLANT
NDL INSUFFLATION 14GA 120MM (NEEDLE) IMPLANT
NEEDLE INSUFFLATION 14GA 120MM (NEEDLE) IMPLANT
PAD POSITIONING PINK XL (MISCELLANEOUS) ×2 IMPLANT
SCISSORS LAP 5X35 DISP (ENDOMECHANICALS) ×2 IMPLANT
SLEEVE ADV FIXATION 5X100MM (TROCAR) ×2 IMPLANT
SPONGE GAUZE 2X2 STER 10/PKG (GAUZE/BANDAGES/DRESSINGS) ×1
SUT MNCRL AB 4-0 PS2 18 (SUTURE) ×2 IMPLANT
SUT PDS AB 1 CT1 27 (SUTURE) ×2 IMPLANT
SUT VIC AB 2-0 SH 27 (SUTURE)
SUT VIC AB 2-0 SH 27X BRD (SUTURE) IMPLANT
SUT VICRYL 0 UR6 27IN ABS (SUTURE) ×2 IMPLANT
TACKER 5MM HERNIA 3.5CML NAB (ENDOMECHANICALS) IMPLANT
TOWEL OR 17X26 10 PK STRL BLUE (TOWEL DISPOSABLE) ×2 IMPLANT
TOWEL OR NON WOVEN STRL DISP B (DISPOSABLE) ×2 IMPLANT
TRAY LAPAROSCOPIC (CUSTOM PROCEDURE TRAY) ×2 IMPLANT
TROCAR ADV FIXATION 5X100MM (TROCAR) ×2 IMPLANT
TROCAR XCEL BLUNT TIP 100MML (ENDOMECHANICALS) ×2 IMPLANT
TUBING INSUF HEATED (TUBING) ×2 IMPLANT

## 2017-01-31 NOTE — Anesthesia Postprocedure Evaluation (Signed)
Anesthesia Post Note  Patient: Austin Webb  Procedure(s) Performed: Procedure(s) (LRB): LAPAROSCOPIC EXPLORATION AND REPAIR OF BILATERAL INGUINAL HERNIAS (N/A)     Patient location during evaluation: PACU Anesthesia Type: General Level of consciousness: awake and alert Pain management: pain level controlled Vital Signs Assessment: post-procedure vital signs reviewed and stable Respiratory status: spontaneous breathing, nonlabored ventilation, respiratory function stable and patient connected to nasal cannula oxygen Cardiovascular status: blood pressure returned to baseline and stable Postop Assessment: no signs of nausea or vomiting Anesthetic complications: no    Last Vitals:  Vitals:   01/31/17 1505 01/31/17 1514  BP: 104/67 111/65  Pulse: 90   Resp: 14 14  Temp:  36.8 C  SpO2: 93% 93%    Last Pain:  Vitals:   01/31/17 1500  TempSrc:   PainSc: 4                  Oral Remache EDWARD

## 2017-01-31 NOTE — Anesthesia Procedure Notes (Signed)
Procedure Name: Intubation Date/Time: 01/31/2017 12:27 PM Performed by: Pilar Grammes Pre-anesthesia Checklist: Patient identified, Emergency Drugs available, Suction available and Patient being monitored Patient Re-evaluated:Patient Re-evaluated prior to induction Oxygen Delivery Method: Circle system utilized Preoxygenation: Pre-oxygenation with 100% oxygen Induction Type: IV induction Ventilation: Mask ventilation without difficulty Laryngoscope Size: Miller and 2 Grade View: Grade I Tube type: Oral Tube size: 7.5 mm Number of attempts: 1 Airway Equipment and Method: Stylet Placement Confirmation: ETT inserted through vocal cords under direct vision,  positive ETCO2 and breath sounds checked- equal and bilateral Secured at: 22 cm Tube secured with: Tape Dental Injury: Teeth and Oropharynx as per pre-operative assessment

## 2017-01-31 NOTE — Interval H&P Note (Signed)
History and Physical Interval Note:  01/31/2017 11:28 AM  Austin Webb  has presented today for surgery, with the diagnosis of Hernias in groins  The various methods of treatment have been discussed with the patient and family. After consideration of risks, benefits and other options for treatment, the patient has consented to  Procedure(s): [place] (N/A) as a surgical intervention .  The patient's history has been reviewed, patient examined, no change in status, stable for surgery.  I have reviewed the patient's chart and labs.  Questions were answered to the patient's satisfaction.    I have re-reviewed the the patient's records, history, medications, and allergies.  I have re-examined the patient.  I again discussed intraoperative plans and goals of post-operative recovery.  The patient agrees to proceed.  Austin Webb  Apr 08, 1941 [id number]  Patient Care Team: Jani Gravel, MD as PCP - General (Internal Medicine) Michael Boston, MD as Consulting Physician (General Surgery) Frederik Pear, MD as Consulting Physician (Orthopedic Surgery)  Patient Active Problem List   Diagnosis Date Noted  . S/P laparoscopic appendectomy 11/03/2013  . HTN (hypertension) 10/13/2013  . Anemia 10/13/2013  . S/P TURP 10/13/2013  . Bladder outlet obstruction 10/13/2013    Past Medical History:  Diagnosis Date  . Anxiety   . Arthritis    Osteoarthritis-left hip, s/p arthroplasty knees, right hip  . Cancer ([place])    basal and squamous cell cancer lesion  . Cancer [facility]) 2002   prostate  . Depression   . GERD (gastroesophageal reflux disease)   . History of kidney stones    x1 passed on own  . Hypertension   . Neuromuscular disorder ([facility])    restless leg syndrome  . Sleep apnea    no cpap used    Past Surgical History:  Procedure Laterality Date  . 4 titanium posts for denture placement    . BUNIONECTOMY Bilateral 08/02/2012  .  CATARACT EXTRACTION Left 2003  . CATARACT EXTRACTION W/PHACO Right 08/23/2015   Procedure: CATARACT EXTRACTION PHACO AND INTRAOCULAR LENS PLACEMENT (IOC);  Surgeon: Birder Robson, MD;  Location: [facility] ORS;  Service: Ophthalmology;  Laterality: Right;  [place] 1:00 AP% 35.2 CDE 10.11 fluid pack lot # [id number]  . CHOLECYSTECTOMY     "stones"- Laparoscopic  . ERCP N/A 02/18/2013   Procedure: ENDOSCOPIC RETROGRADE CHOLANGIOPANCREATOGRAPHY (ERCP);  Surgeon: Arta Silence, MD;  Location: [facility];  Service: Endoscopy;  Laterality: N/A;  . EUS N/A 02/18/2013   Procedure: ESOPHAGEAL ENDOSCOPIC ULTRASOUND (EUS) RADIAL;  Surgeon: Arta Silence, MD;  Location: [facility] ENDOSCOPY;  Service: Endoscopy;  Laterality: N/A;  . EYE SURGERY    . HAMMER TOE SURGERY Left    and bunionectomy  . HAND ARTHROTOMY Right    thumb surgery  . HIP ARTHROPLASTY Right   . JOINT REPLACEMENT    . KNEE ARTHROPLASTY Bilateral 2000  . LAPAROSCOPIC APPENDECTOMY N/A 11/03/2013   Procedure: APPENDECTOMY LAPAROSCOPIC/ PRIMARY INCISIONAL HERNIA REPAIR;  Surgeon: Rolm Bookbinder, MD;  Location: [facility] ORS;  Service: General;  Laterality: N/A;  . TRANSURETHRAL RESECTION OF PROSTATE      Social History   Social History  . Marital status: Married    Spouse name: N/A  . Number of children: N/A  . Years of education: N/A   Occupational History  . Not on file.   Social History Main Topics  . Smoking status: Former Smoker    Packs/day: 1.00    Years: 12.00    Quit  date: 06/04/1982  . Smokeless tobacco: Never Used     Comment: no passive smokers in home  . Alcohol use Yes     Comment: rare   . Drug use: No  . Sexual activity: Yes   Other Topics Concern  . Not on file   Social History Narrative  . No narrative on file    Family History  Problem Relation Age of Onset  . Diabetes Mellitus II Neg Hx   . CAD Neg Hx     Current Facility-Administered Medications  Medication Dose Route Frequency Provider Last Rate Last Dose   . ceFAZolin (ANCEF) 2-4 GM/100ML-% IVPB           . ceFAZolin (ANCEF) IVPB 2g/100 mL premix  2 g Intravenous On Call to OR Michael Boston, MD      . Chlorhexidine Gluconate Cloth 2 % PADS 6 each  6 each Topical Once Michael Boston, MD       And  . Chlorhexidine Gluconate Cloth 2 % PADS 6 each  6 each Topical Once Michael Boston, MD      . lactated ringers infusion   Intravenous Continuous Lyn Hollingshead, MD 100 mL/hr at 01/31/17 1010       Allergies  Allergen Reactions  . Arthrotec [Diclofenac-Misoprostol] Diarrhea    BP (!) 143/93   Pulse 60   Temp 98.2 F (36.8 C) (Oral)   Resp 18   SpO2 97%   Labs: No results found for this or any previous visit (from the past 48 hour(s)).  Imaging / Studies: No results found.   [name], M.D., F.A.WebbS. Gastrointestinal and Minimally Invasive Surgery [facility] [address], [place], [place] [address] [phone] Main / Paging  01/31/2017 11:28 AM    Austin Bosket C.

## 2017-01-31 NOTE — Anesthesia Preprocedure Evaluation (Addendum)
Anesthesia Evaluation  Patient identified by MRN, date of birth, ID band Patient awake    Reviewed: Allergy & Precautions, H&P , Patient's Chart, lab work & pertinent test results, reviewed documented beta blocker date and time   Airway Mallampati: II  TM Distance: >3 FB Neck ROM: full    Dental no notable dental hx.    Pulmonary former smoker,    Pulmonary exam normal breath sounds clear to auscultation       Cardiovascular hypertension,  Rhythm:regular Rate:Normal     Neuro/Psych    GI/Hepatic   Endo/Other    Renal/GU      Musculoskeletal   Abdominal   Peds  Hematology   Anesthesia Other Findings Hypertension     GERD  restless leg syndrome  Arthritis   Osteoarthritis-left hip, s/p arthroplasty knees, right hip Depression    Sleep apnea  no cpap used    Reproductive/Obstetrics                             Anesthesia Physical Anesthesia Plan  ASA: II  Anesthesia Plan: General   Post-op Pain Management:    Induction: Intravenous  PONV Risk Score and Plan: 2 and Ondansetron, Dexamethasone and Treatment may vary due to age or medical condition  Airway Management Planned: Oral ETT  Additional Equipment:   Intra-op Plan:   Post-operative Plan: Extubation in OR  Informed Consent: I have reviewed the patients History and Physical, chart, labs and discussed the procedure including the risks, benefits and alternatives for the proposed anesthesia with the patient or authorized representative who has indicated his/her understanding and acceptance.   Dental Advisory Given  Plan Discussed with: CRNA and Surgeon  Anesthesia Plan Comments: (  )        Anesthesia Quick Evaluation

## 2017-01-31 NOTE — H&P (Signed)
KASHIS PENLEY 12/31/2016 8:43 AM Location: [facility] Patient #: [id number] DOB: 01-30-1941 Married / Language: English / Race: White Male   Patient Care Team: Jani Gravel, MD as PCP - General (Internal Medicine) Michael Boston, MD as Consulting Physician (General Surgery) Frederik Pear, MD as Consulting Physician (Orthopedic Surgery)  History of Present Illness [name] MD; 12/31/2016 9:18 AM) The patient is a 76 year old male who presents with an inguinal hernia. Note for "Inguinal hernia": ` ` ` Patient sent for surgical consultation at the request of Dr. Jani Gravel  Chief Complaint: Inguinal hernia  The patient is a 76 year old male. Had umbilical hernia repair and appendectomy to my partner 2015. Noted a swelling in right groin. Diagnosed by his primary care physician with a right inguinal hernia. Recent CAT scan showed bilateral inguinal hernias. Surgical consultation requested.   Patient comes in today with his wife. His noticeable increasing bulging the right side especially when he stands up. Occasionally uncomfortable. Wonders if somethings on the left as well. Goes away when he lies down. Rather physically active. Does a lot of yard work. Can walk a half hour without difficulty. Usually moves his bowels every morning. He is on aspirin but no other blood thinners. He's had a fair amount of orthopedic and other surgeries. A lot of joint surgery. Initially he wondered if he had recurrent right hip issues. Workup by his orthopedic surgeon, Dr. Chriss Czar was negative. Suspicion of hernia was raised. Suspicion verified by primary care physician. Therefore, he was sent to me. Had cholecystectomy and appendectomy in the past. No hernia surgeries. No history of infections. He does not smoke.  Ready to consider surgery  (Review of systems as stated in this history (HPI) or in the review of systems. Otherwise all other 12 point ROS are  negative)   Allergies Dalbert Mayotte, CMA; 12/31/2016 8:44 AM) No Known Drug Allergies 12/31/2016 Allergies Reconciled   Medication History Dalbert Mayotte, CMA; 12/31/2016 8:46 AM) Sertraline HCl (100MG  Tablet, Oral) Active. Allopurinol (100MG  Tablet, Oral) Active. Amlodipine Besylate-Valsartan (10-160MG  Tablet, Oral) Active. Fish Oil + D3 (1000-1000MG -UNIT Capsule, Oral) Active. Aspirin (81MG  Capsule, 1 (one) Oral) Active. Multiple Vitamin (1 (one) Oral) Active. Medications Reconciled  Vitals Dalbert Mayotte CMA; 12/31/2016 8:47 AM) 12/31/2016 8:46 AM Weight: 198 lb Height: 70in Body Surface Area: 2.08 m Body Mass Index: 28.41 kg/m  Temp.: 98.82F  Pulse: 61 (Regular)  BP: 112/90 (Sitting, Left Arm, Standard)  BP (!) 143/93   Pulse 60   Temp 98.2 F (36.8 C) (Oral)   Resp 18   SpO2 97%       Physical Exam [name] MD; 12/31/2016 9:14 AM) General Mental Status-Alert. General Appearance-Not in acute distress, Not Sickly. Orientation-Oriented X3. Hydration-Well hydrated. Voice-Normal.  Integumentary Global Assessment Upon inspection and palpation of skin surfaces of the - Axillae: non-tender, no inflammation or ulceration, no drainage. and Distribution of scalp and body hair is normal. General Characteristics Temperature - normal warmth is noted.  Head and Neck Head-normocephalic, atraumatic with no lesions or palpable masses. Face Global Assessment - atraumatic, no absence of expression. Neck Global Assessment - no abnormal movements, no bruit auscultated on the right, no bruit auscultated on the left, no decreased range of motion, non-tender. Trachea-midline. Thyroid Gland Characteristics - non-tender.  Eye Eyeball - Left-Extraocular movements intact, No Nystagmus. Eyeball - Right-Extraocular movements intact, No Nystagmus. Cornea - Left-No Hazy. Cornea - Right-No Hazy. Sclera/Conjunctiva - Left-No  scleral icterus, No Discharge. Sclera/Conjunctiva - Right-No  scleral icterus, No Discharge. Pupil - Left-Direct reaction to light normal. Pupil - Right-Direct reaction to light normal.  ENMT Ears Pinna - Left - no drainage observed, no generalized tenderness observed. Right - no drainage observed, no generalized tenderness observed. Nose and Sinuses External Inspection of the Nose - no destructive lesion observed. Inspection of the nares - Left - quiet respiration. Right - quiet respiration. Mouth and Throat Lips - Upper Lip - no fissures observed, no pallor noted. Lower Lip - no fissures observed, no pallor noted. Nasopharynx - no discharge present. Oral Cavity/Oropharynx - Tongue - no dryness observed. Oral Mucosa - no cyanosis observed. Hypopharynx - no evidence of airway distress observed.  Chest and Lung Exam Inspection Movements - Normal and Symmetrical. Accessory muscles - No use of accessory muscles in breathing. Palpation Palpation of the chest reveals - Non-tender. Auscultation Breath sounds - Normal and Clear.  Cardiovascular Auscultation Rhythm - Regular. Murmurs & Other Heart Sounds - Auscultation of the heart reveals - No Murmurs and No Systolic Clicks.  Abdomen Inspection Inspection of the abdomen reveals - No Visible peristalsis and No Abnormal pulsations. Umbilicus - No Bleeding, No Urine drainage. Palpation/Percussion Palpation and Percussion of the abdomen reveal - Soft, Non Tender, No Rebound tenderness, No Rigidity (guarding) and No Cutaneous hyperesthesia. Note: Abdomen soft. Nontender, nondistended. No guarding. No diastasis. No umbilical nor other hernias   Male Genitourinary Sexual Maturity Tanner 5 - Adult hair pattern and Adult penile size and shape. Note: Obvious right groin swelling consistent with hernia. Reducible. Swelling of left groin on Valsalva consistent with left inguinal hernia as well. Uncircumcised male. normal external male  genitalia otherwise.   Peripheral Vascular Upper Extremity Inspection - Left - No Cyanotic nailbeds, Not Ischemic. Right - No Cyanotic nailbeds, Not Ischemic.  Neurologic Neurologic evaluation reveals -normal attention span and ability to concentrate, able to name objects and repeat phrases. Appropriate fund of knowledge , normal sensation and normal coordination. Mental Status Affect - not angry, not paranoid. Cranial Nerves-Normal Bilaterally. Gait-Normal.  Neuropsychiatric Mental status exam performed with findings of-able to articulate well with normal speech/language, rate, volume and coherence, thought content normal with ability to perform basic computations and apply abstract reasoning and no evidence of hallucinations, delusions, obsessions or homicidal/suicidal ideation.  Musculoskeletal Global Assessment Spine, Ribs and Pelvis - no instability, subluxation or laxity. Right Upper Extremity - no instability, subluxation or laxity.  Lymphatic Head & Neck  General Head & Neck Lymphatics: Bilateral - Description - No Localized lymphadenopathy. Axillary  General Axillary Region: Bilateral - Description - No Localized lymphadenopathy. Femoral & Inguinal  Generalized Femoral & Inguinal Lymphatics: Left - Description - No Localized lymphadenopathy. Right - Description - No Localized lymphadenopathy.    Assessment & Plan  BILATERAL INGUINAL HERNIA WITHOUT OBSTRUCTION OR GANGRENE, RECURRENCE NOT SPECIFIED (K40.20) Impression: Bilateral renal hernias. Right greater than left. I think you very good candidate for laparoscopic underlay repair. Had a long discussion with the patient and his wife. They wish to try and get it done soon.  Given his urinary issues, I think perioperative Flomax would be a good idea to lower the chance of urinary retention since he has bilateral hernias  Ready for surgery  PREOP - ING HERNIA - ENCOUNTER FOR PREOPERATIVE EXAMINATION FOR GENERAL  SURGICAL PROCEDURE (Z01.818) Current Plans You are being scheduled for surgery- Our schedulers will call you.  You should hear from our office's scheduling department within 5 working days about the location, date, and time of surgery.  We try to make accommodations for patient's preferences in scheduling surgery, but sometimes the OR schedule or the surgeon's schedule prevents [place] from making those accommodations.  If you have not heard from our office [phone]) in 5 working days, call the office and ask for your surgeon's nurse.  If you have other questions about your diagnosis, plan, or surgery, call the office and ask for your surgeon's nurse.  Written instructions provided The anatomy & physiology of the abdominal wall and pelvic floor was discussed. The pathophysiology of hernias in the inguinal and pelvic region was discussed. Natural history risks such as progressive enlargement, pain, incarceration, and strangulation was discussed. Contributors to complications such as smoking, obesity, diabetes, prior surgery, etc were discussed.  I feel the risks of no intervention will lead to serious problems that outweigh the operative risks; therefore, I recommended surgery to reduce and repair the hernia. I explained laparoscopic techniques with possible need for an open approach. I noted usual use of mesh to patch and/or buttress hernia repair  Risks such as bleeding, infection, abscess, need for further treatment, heart attack, death, and other risks were discussed. I noted a good likelihood this will help address the problem. Goals of post-operative recovery were discussed as well. Possibility that this will not correct all symptoms was explained. I stressed the importance of low-impact activity, aggressive pain control, avoiding constipation, & not pushing through pain to minimize risk of post-operative chronic pain or injury. Possibility of reherniation was discussed. We will  work to minimize complications.  An educational handout further explaining the pathology & treatment options was given as well. Questions were answered. The patient expresses understanding & wishes to proceed with surgery.  Pt Education - Pamphlet Given - Laparoscopic Hernia Repair: discussed with patient and provided information. Pt Education - [facility] Pain Control (Fe Okubo) Pt Education - [facility] Hernia Post-Op HCI (Gustav Knueppel): discussed with patient and provided information. 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 Amalia Hailey at [facility].  His risk of urinary retention preoperatively is increased. Give perioperative Flomax to help lower that chance. One week before. One week after. Current Plans Started Flomax 0.4MG , 1 (one) Capsule daily, #14, 14 days starting 12/31/2016, Ref. x2. Local Order: to shrink the prostate and help with urination  [name], M.D., F.A.C.S. Gastrointestinal and Minimally Invasive Surgery [facility] [address] [place], [place] [address] [phone] Main / Paging

## 2017-01-31 NOTE — Progress Notes (Signed)
PACU RNs came to [place] to transfer patient to PACU for admission as long wait for hospital bed. Patient is to be admitted for hypotension. Dr Marcello Moores on call ; however,  She has been delayed in OR. Long wait for admission orders.

## 2017-01-31 NOTE — Transfer of Care (Signed)
Immediate Anesthesia Transfer of Care Note  Patient: Austin Webb  Procedure(s) Performed: Procedure(s): LAPAROSCOPIC EXPLORATION AND REPAIR OF BILATERAL INGUINAL HERNIAS (N/A)  Patient Location: PACU  Anesthesia Type:General  Level of Consciousness: sedated and responds to stimulation  Airway & Oxygen Therapy: Patient connected to face mask oxygen  Post-op Assessment: Report given to RN and Post -op Vital signs reviewed and stable  Post vital signs: stable  Last Vitals:  Vitals:   01/31/17 0913  BP: (!) 143/93  Pulse: 60  Resp: 18  Temp: 36.8 C  SpO2: 97%    Last Pain:  Vitals:   01/31/17 0913  TempSrc: Oral         Complications: No apparent anesthesia complications

## 2017-01-31 NOTE — Op Note (Addendum)
01/31/2017  2:12 PM  PATIENT:  Austin Webb  76 y.o. male  Patient Care Team: Jani Gravel, MD as PCP - General (Internal Medicine) Michael Boston, MD as Consulting Physician (General Surgery) Frederik Pear, MD as Consulting Physician (Orthopedic Surgery)  PRE-OPERATIVE DIAGNOSIS:  Hernias in groins  POST-OPERATIVE DIAGNOSIS:    Bilateral inguinal hernias.   Umbilical hernia.  PROCEDURE:    LAPAROSCOPIC REPAIR OF BILATERAL INGUINAL HERNIAS Primary umbilical hernia repair  SURGEON:  [name], MD  ASSISTANT: None  ANESTHESIA:     Regional ilioinguinal and genitofemoral and spermatic cord nerve blocks  General  EBL:  Total I/O In: 800 [I.V.:800] Out: 25 [Blood:25].  See anesthesia record  Delay start of Pharmacological VTE agent (>24hrs) due to surgical blood loss or risk of bleeding:  no  DRAINS: NONE  SPECIMEN:  NONE  DISPOSITION OF SPECIMEN:  N/A  COUNTS:  YES  PLAN OF CARE: Discharge to home after PACU  PATIENT DISPOSITION:  PACU - hemodynamically stable.  INDICATION: patient with symptomatic right greater than left inguinal hernias.  The anatomy & physiology of the abdominal wall and pelvic floor was discussed.  The pathophysiology of hernias in the inguinal and pelvic region was discussed.  Natural history risks such as progressive enlargement, pain, incarceration & strangulation was discussed.   Contributors to complications such as smoking, obesity, diabetes, prior surgery, etc were discussed.    I feel the risks of no intervention will lead to serious problems that outweigh the operative risks; therefore, I recommended surgery to reduce and repair the hernia.  I explained laparoscopic techniques with possible need for an open approach.  I noted usual use of mesh to patch and/or buttress hernia repair  Risks such as bleeding, infection, abscess, need for further treatment, heart attack, death, and other risks were discussed.  I noted a good likelihood  this will help address the problem.   Goals of post-operative recovery were discussed as well.  Possibility that this will not correct all symptoms was explained.  I stressed the importance of low-impact activity, aggressive pain control, avoiding constipation, & not pushing through pain to minimize risk of post-operative chronic pain or injury. Possibility of reherniation was discussed.  We will work to minimize complications.     An educational handout further explaining the pathology & treatment options was given as well.  Questions were answered.  The patient expresses understanding & wishes to proceed with surgery.  OR FINDINGS: right direct and left indirect inguinal hernias. Small umbilical hernia  DESCRIPTION:  The patient was identified & brought into the operating room. The patient was positioned supine with arms tucked. SCDs were active during the entire case. The patient underwent general anesthesia without any difficulty.  The abdomen was prepped and draped in a sterile fashion. The patient's bladder was emptied.  A Surgical Timeout confirmed our plan.  I made a transverse incision through the inferior umbilical fold.  I made a small transverse nick through the anterior rectus fascia contralateral to the inguinal hernia side and placed a 0-vicryl stitch through the fascia.  I placed a Hasson trocar into the preperitoneal plane.  Entry was clean.  We induced carbon dioxide insufflation. Camera inspection revealed no injury.  I used a 16mm angled scope to bluntly free the peritoneum off the infraumbilical anterior abdominal wall.  I created enough of a preperitoneal pocket to place 2mm ports into the right & left mid-abdomen into this preperitoneal cavity.  I focused attention on the  LEFT pelvis.   I used blunt & focused sharp dissection to free the peritoneum off the flank and down to the pubic rim.  I freed the anteriolateral bladder wall off the anteriolateral pelvic wall, sparing midline  attachments.   I located a swath of peritoneum going into a hernia fascial defect at the  internal ring consistent with  an indirect inguinal hernia..  I gradually freed the peritoneal hernia sac off safely and reduced it into the preperitoneal space.  I freed the peritoneum off the  spermatic vessels & vas deferens.  I freed peritoneum off the retroperitoneum along the psoas muscle.  Spermatic cord lipoma was dissected away & removed.  I checked & assured hemostasis.     I turned attention on the opposite  RIGHT pelvis.  I did dissection in a similar, mirror-image fashion. The patient had a direct space inguinal hernia.Marland Kitchen   Spermatic cord lipoma was dissected away & removed.    I checked & assured hemostasis.  I did do laparoscopic intracorporeal suturing with 2-0 Vicryl to close the hernia sac down for a high ligation   I chose 15x15 cm sheets of ultra-lightweight polypropylene mesh (Ultrapro), one for each side.  I cut a single sigmoid-shaped slit ~6cm from a corner of each mesh.  I placed the meshes into the preperitoneal space & laid them as overlapping diamonds such that at the inferior points, a 6x6 cm corner flap rested in the true anterolateral pelvis, covering the obturator & femoral foramina.   I allowed the bladder to return to the pubis, this helping tuck the corners of the mesh in the anteriolateral pelvis.  The medial corners overlapped each other across midline cephalad to the pubic rim.   Given the numerous hernias of moderate size, I placed a third 15x15cm mesh in the center as a vertical diamond.  The lateral wings of the mesh overlap across the direct spaces and internal rings where the dominant hernias were.  This provided good coverage and reinforcement of the hernia repairs.  Because of the central mesh placement with good overlap, I did not place any tacks.   I held the hernia sacs cephalad & evacuated carbon dioxide.  I removed the umbilical stalk off the fascia to expose the small but  definite umbilical hernia.  This was primarily closed with #1 PDS interrupted suture.  I re-tacked the umbilicus with 0 PDS suture.  I closed the skin using 4-0 monocryl stitch.  Sterile dressings were applied.   The patient was extubated & arrived in the PACU in stable condition..  I had discussed postoperative care with the patient in the holding area.  Instructions are written in the chart.  I discussed operative findings, updated the patient's status, discussed probable steps to recovery, and gave postoperative recommendations to the patient's spouse.  Recommendations were made.  Questions were answered.  She expressed understanding & appreciation.   [name], M.D., F.A.C.S. Gastrointestinal and Minimally Invasive Surgery [facility] [address] [place], [place] [address] [phone] Main / Paging  01/31/2017 2:12 PM

## 2017-01-31 NOTE — Progress Notes (Addendum)
Attempted to get patient up to ambulated. Sat patient up on edge of bed and he became very quiet and pale. Back to bed. Will transition to recliner chair.  1650  Patient transferred to recliner chair. Tolerated well.   1700   Checked on patient. He feels dizzy and is pale. BP  76/ 55 and pulse 91. Cold wash cloth to forehead and feet elevated.  1710  Patient feels better.  [employer] attempted to have patient stand with gait belt and walker. He immediately became pale and flopped back down into chair. Feet up. IV fluids running. Oxygen at 2L/min via [place] applied. Paged Dr Leighton Ruff who is on call for [facility].

## 2017-02-01 ENCOUNTER — Encounter (HOSPITAL_COMMUNITY): Payer: Self-pay | Admitting: *Deleted

## 2017-02-01 ENCOUNTER — Observation Stay (HOSPITAL_COMMUNITY): Payer: Medicare HMO

## 2017-02-01 DIAGNOSIS — R42 Dizziness and giddiness: Secondary | ICD-10-CM

## 2017-02-01 DIAGNOSIS — K4020 Bilateral inguinal hernia, without obstruction or gangrene, not specified as recurrent: Secondary | ICD-10-CM | POA: Diagnosis not present

## 2017-02-01 DIAGNOSIS — R1110 Vomiting, unspecified: Secondary | ICD-10-CM | POA: Diagnosis not present

## 2017-02-01 DIAGNOSIS — R39198 Other difficulties with micturition: Secondary | ICD-10-CM

## 2017-02-01 DIAGNOSIS — J9811 Atelectasis: Secondary | ICD-10-CM | POA: Diagnosis not present

## 2017-02-01 DIAGNOSIS — R1114 Bilious vomiting: Secondary | ICD-10-CM

## 2017-02-01 LAB — CBC
HCT: 36.8 % — ABNORMAL LOW (ref 39.0–52.0)
Hemoglobin: 12.7 g/dL — ABNORMAL LOW (ref 13.0–17.0)
MCH: 30.2 pg (ref 26.0–34.0)
MCHC: 34.5 g/dL (ref 30.0–36.0)
MCV: 87.6 fL (ref 78.0–100.0)
Platelets: 186 10*3/uL (ref 150–400)
RBC: 4.2 MIL/uL — ABNORMAL LOW (ref 4.22–5.81)
RDW: 13.4 % (ref 11.5–15.5)
WBC: 16.7 10*3/uL — ABNORMAL HIGH (ref 4.0–10.5)

## 2017-02-01 MED ORDER — PHENOL 1.4 % MT LIQD: 1.0000 | OROMUCOSAL | Status: DC | PRN

## 2017-02-01 MED ORDER — SCOPOLAMINE 1 MG/3DAYS TD PT72
1.0000 | MEDICATED_PATCH | TRANSDERMAL | Status: DC
  Administered 2017-02-01: 1.5 mg via TRANSDERMAL
  Filled 2017-02-01: qty 1

## 2017-02-01 MED ORDER — MAGIC MOUTHWASH
15.0000 mL | Freq: Four times a day (QID) | ORAL | Status: DC | PRN
  Filled 2017-02-01: qty 15

## 2017-02-01 MED ORDER — GUAIFENESIN-DM 100-10 MG/5ML PO SYRP: 10.0000 mL | ORAL_SOLUTION | ORAL | Status: DC | PRN

## 2017-02-01 MED ORDER — FENTANYL CITRATE (PF) 100 MCG/2ML IJ SOLN: 25.0000 ug | INTRAMUSCULAR | Status: DC | PRN

## 2017-02-01 MED ORDER — HYDROCORTISONE 1 % EX CREA: 1.0000 "application " | TOPICAL_CREAM | Freq: Three times a day (TID) | CUTANEOUS | Status: DC | PRN

## 2017-02-01 MED ORDER — ALUM & MAG HYDROXIDE-SIMETH 200-200-20 MG/5ML PO SUSP: 30.0000 mL | Freq: Four times a day (QID) | ORAL | Status: DC | PRN

## 2017-02-01 MED ORDER — TRAMADOL HCL 50 MG PO TABS: 50.0000 mg | ORAL_TABLET | Freq: Four times a day (QID) | ORAL | Status: DC | PRN

## 2017-02-01 MED ORDER — SACCHAROMYCES BOULARDII 250 MG PO CAPS
250.0000 mg | ORAL_CAPSULE | Freq: Two times a day (BID) | ORAL | Status: DC
  Administered 2017-02-01 – 2017-02-02 (×3): 250 mg via ORAL
  Filled 2017-02-01 (×3): qty 1

## 2017-02-01 MED ORDER — PROCHLORPERAZINE EDISYLATE 5 MG/ML IJ SOLN: 5.0000 mg | INTRAMUSCULAR | Status: DC | PRN

## 2017-02-01 MED ORDER — METOPROLOL TARTRATE 5 MG/5ML IV SOLN: 5.0000 mg | Freq: Four times a day (QID) | INTRAVENOUS | Status: DC | PRN

## 2017-02-01 MED ORDER — PANTOPRAZOLE SODIUM 40 MG IV SOLR
40.0000 mg | Freq: Two times a day (BID) | INTRAVENOUS | Status: DC
  Administered 2017-02-01 (×2): 40 mg via INTRAVENOUS
  Filled 2017-02-01 (×2): qty 40

## 2017-02-01 MED ORDER — HYDROCORTISONE 2.5 % RE CREA: 1.0000 "application " | TOPICAL_CREAM | Freq: Four times a day (QID) | RECTAL | Status: DC | PRN

## 2017-02-01 MED ORDER — BISACODYL 10 MG RE SUPP: 10.0000 mg | Freq: Two times a day (BID) | RECTAL | Status: DC | PRN

## 2017-02-01 MED ORDER — MENTHOL 3 MG MT LOZG: 1.0000 | LOZENGE | OROMUCOSAL | Status: DC | PRN

## 2017-02-01 MED ORDER — LACTATED RINGERS IV BOLUS (SEPSIS)
1000.0000 mL | Freq: Three times a day (TID) | INTRAVENOUS | Status: DC | PRN
  Administered 2017-02-01: 1000 mL via INTRAVENOUS

## 2017-02-01 MED ORDER — METHOCARBAMOL 1000 MG/10ML IJ SOLN
1000.0000 mg | Freq: Four times a day (QID) | INTRAVENOUS | Status: DC | PRN
  Filled 2017-02-01: qty 10

## 2017-02-01 MED ORDER — LIP MEDEX EX OINT
1.0000 "application " | TOPICAL_OINTMENT | Freq: Two times a day (BID) | CUTANEOUS | Status: DC
  Administered 2017-02-01 – 2017-02-02 (×3): 1 via TOPICAL
  Filled 2017-02-01 (×3): qty 7

## 2017-02-01 MED ORDER — METOCLOPRAMIDE HCL 5 MG/ML IJ SOLN: 5.0000 mg | Freq: Four times a day (QID) | INTRAMUSCULAR | Status: DC | PRN

## 2017-02-01 MED ORDER — LACTATED RINGERS IV BOLUS (SEPSIS)
1000.0000 mL | Freq: Once | INTRAVENOUS | Status: AC
  Administered 2017-02-01: 1000 mL via INTRAVENOUS

## 2017-02-01 MED ORDER — ACETAMINOPHEN 500 MG PO TABS
1000.0000 mg | ORAL_TABLET | Freq: Three times a day (TID) | ORAL | Status: DC
  Administered 2017-02-01 – 2017-02-02 (×3): 1000 mg via ORAL
  Filled 2017-02-01 (×4): qty 2

## 2017-02-01 MED ORDER — LACTATED RINGERS IV SOLN
INTRAVENOUS | Status: DC
  Administered 2017-02-01: 11:00:00 via INTRAVENOUS

## 2017-02-01 MED ORDER — ONDANSETRON HCL 4 MG/2ML IJ SOLN
4.0000 mg | Freq: Four times a day (QID) | INTRAMUSCULAR | Status: DC
  Administered 2017-02-01 – 2017-02-02 (×3): 4 mg via INTRAVENOUS
  Filled 2017-02-01 (×3): qty 2

## 2017-02-01 MED ORDER — PROMETHAZINE HCL 25 MG/ML IJ SOLN
12.5000 mg | Freq: Four times a day (QID) | INTRAMUSCULAR | Status: DC | PRN
  Administered 2017-02-01: 12.5 mg via INTRAVENOUS
  Filled 2017-02-01: qty 1

## 2017-02-01 MED ORDER — SIMETHICONE 40 MG/0.6ML PO SUSP
40.0000 mg | Freq: Four times a day (QID) | ORAL | Status: DC | PRN
  Filled 2017-02-01: qty 0.6

## 2017-02-01 NOTE — Progress Notes (Addendum)
[facility] [place], [place] [address] Phone: [phone]  FAX: [phone]      BENTLY WYSS [id number] [birth year]  CARE TEAM:  PCP: Jani Gravel, MD  Outpatient Care Team: Patient Care Team: Jani Gravel, MD as PCP - General (Internal Medicine) Michael Boston, MD as Consulting Physician (General Surgery) Frederik Pear, MD as Consulting Physician (Orthopedic Surgery)  Inpatient Treatment Team: Treatment Team: Attending Provider: Michael Boston, MD   Problem List:   Principal Problem:   Bilateral inguinal hernias s/p lap repair with mesh 01/31/2017 Active Problems:   Umbilical hernia s/p primary repair 01/31/2017   Dizziness   Difficulty urinating   Bilious emesis   1 Day Post-Op  01/31/2017  Procedure(s): LAPAROSCOPIC EXPLORATION AND REPAIR OF BILATERAL INGUINAL HERNIAS   Assessment  Ileus ?PO nausea  Plan:  -change narcotics -nausea control -scopolamine patch -IVF -Hgb lower but not severe - BP OK - follow.  Hold amlodipine & use PRN backup -foley placement for possible urinary retention -check films -VTE prophylaxis- SCDs, etc -mobilize as tolerated to help recovery  20 minutes spent in review, evaluation, examination, counseling, and coordination of care.  More than 50% of that time was spent in counseling.  [name], M.D., F.A.C.S. Gastrointestinal and Minimally Invasive Surgery [facility] [address], [place], [place] [address] [phone] Main / Paging   02/01/2017    Subjective: (Chief complaint)  Vomiting last night Dizzy when standing & hypotensive last night - not now "Room spinning" especially last night Less nauseated now No urinating since surgery   Objective:  Vital signs:  Vitals:   01/31/17 2000 01/31/17 2114 01/31/17 2128 02/01/17 0502  BP: 114/73 111/68 120/64 (!) 150/78  Pulse: 65 77 69 66  Resp: (!) 23 20 17 18    Temp: 97.6 F (36.4 C) 97.8 F (36.6 C) 97.8 F (36.6 C) 98 F (36.7 C)  TempSrc:   Oral Oral  SpO2: 99% 98% 94% 96%    Last BM Date: 01/31/17  Intake/Output   Yesterday:  08/30 0701 - 08/31 0700 In: 2105 [P.O.:360; I.V.:1745] Out: 1225 [Urine:600; Emesis/NG output:600; Blood:25] This shift:  No intake/output data recorded.  Bowel function:  Flatus: No  BM:  No  Drain: (No drain)   Physical Exam:  General: Pt awake/alert/oriented x4 in no acute distress.  Tired, not toxic Eyes: PERRL, normal EOM.  Sclera clear.  No icterus Neuro: CN II-XII intact w/o focal sensory/motor deficits. Lymph: No head/neck/groin lymphadenopathy Psych:  No delerium/psychosis/paranoia HENT: Normocephalic, Mucus membranes moist.  No thrush Neck: Supple, No tracheal deviation Chest: No chest wall pain w good excursion CV:  Pulses intact.  Regular rhythm MS: Normal AROM mjr joints.  No obvious deformity  Abdomen: Soft.  Moderately distended.  Nontender.  No evidence of peritonitis.  No incarcerated hernias.  Ext:   No deformity.  No mjr edema.  No cyanosis Skin: No petechiae / purpura  Results:   Labs: Results for orders placed or performed during the hospital encounter of 01/31/17 (from the past 48 hour(s))  CBC     Status: Abnormal   Collection Time: 02/01/17  5:41 AM  Result Value Ref Range   WBC 16.7 (H) 4.0 - 10.5 K/uL   RBC 4.20 (L) 4.22 - 5.81 MIL/uL   Hemoglobin 12.7 (L) 13.0 - 17.0 g/dL   HCT 36.8 (L) 39.0 - 52.0 %   MCV 87.6 78.0 - 100.0 fL  MCH 30.2 26.0 - 34.0 pg   MCHC 34.5 30.0 - 36.0 g/dL   RDW 13.4 11.5 - 15.5 %   Platelets 186 150 - 400 K/uL    Imaging / Studies: Dg Chest Port 1 View  Result Date: 02/01/2017 CLINICAL DATA:  Emesis. EXAM: PORTABLE CHEST 1 VIEW COMPARISON:  05/17/2010. FINDINGS: Mediastinum hilar structures normal. Stable mild cardiomegaly with normal pulmonary vascularity. Low lung volumes with mild basilar atelectasis. Gastric distention noted.  IMPRESSION: 1.  Stable mild cardiomegaly .  No pulmonary venous congestion. 2. Low lung volumes with bibasilar atelectasis. 3. Gastric distention . Electronically Signed   By: Marcello Moores  Register   On: 02/01/2017 06:26    Medications / Allergies: per chart  Antibiotics: Anti-infectives    Start     Dose/Rate Route Frequency Ordered Stop   01/31/17 1117  ceFAZolin (ANCEF) 2-4 GM/100ML-% IVPB    Comments:  Waldron Session   : cabinet override      01/31/17 1117 01/31/17 1239   01/31/17 0945  ceFAZolin (ANCEF) IVPB 2g/100 mL premix     2 g 200 mL/hr over 30 Minutes Intravenous On call to O.R. 01/31/17 0945 01/31/17 1239        Note: Portions of this report may have been transcribed using voice recognition software. Every effort was made to ensure accuracy; however, inadvertent computerized transcription errors may be present.   Any transcriptional errors that result from this process are unintentional.     [name], M.D., F.A.C.S. Gastrointestinal and Minimally Invasive Surgery [facility] [address] [place], [place] [address] [phone] Main / Paging   02/01/2017

## 2017-02-01 NOTE — Progress Notes (Signed)
Rested quietly until 0400.  Four episodes of nausea/vominting( approx 150 cc dark bilious x 4)min relief from zofran at 0500.Marland Kitchen On call notified. Phenergan 12.5mg ,stat chest xray, npo initiated.  No void since 2000, bladder scan x 4(0cc x 4) bladder non-distended. Vital signs stable continue to monitior

## 2017-02-01 NOTE — Care Management Note (Signed)
Case Management Note  Patient Details  Name: ABDIMALIK MAYORQUIN MRN: [id number] Date of Birth: 09/02/1940  Subjective/Objective:     Pod 1 lap.expl. And repair of bil. Inguinal hernias.               Action/Plan: Date:  February 01, 2017 Chart reviewed for concurrent status and case management needs. Will continue to follow patient progress. Discharge Planning: following for needs Expected discharge date: [id number] Velva Harman, BSN, [place], [place]  Expected Discharge Date:  01/31/17               Expected Discharge Plan:  Home/Self Care  In-House Referral:     Discharge planning Services  CM Consult  Post Acute Care Choice:    Choice offered to:     DME Arranged:    DME Agency:     HH Arranged:    HH Agency:     Status of Service:  In process, will continue to follow  If discussed at Long Length of Stay Meetings, dates discussed:    Additional Comments:  Leeroy Cha, RN 02/01/2017, 9:08 AM

## 2017-02-02 DIAGNOSIS — K4020 Bilateral inguinal hernia, without obstruction or gangrene, not specified as recurrent: Secondary | ICD-10-CM | POA: Diagnosis not present

## 2017-02-02 MED ORDER — ASPIRIN EC 81 MG PO TBEC: 81.0000 mg | DELAYED_RELEASE_TABLET | Freq: Every day | ORAL | Status: DC

## 2017-02-02 MED ORDER — PANTOPRAZOLE SODIUM 40 MG PO TBEC
40.0000 mg | DELAYED_RELEASE_TABLET | Freq: Every day | ORAL | Status: DC
  Administered 2017-02-02: 40 mg via ORAL
  Filled 2017-02-02: qty 1

## 2017-02-02 MED ORDER — ADULT MULTIVITAMIN W/MINERALS CH
1.0000 | ORAL_TABLET | Freq: Every day | ORAL | Status: DC
  Administered 2017-02-02: 1 via ORAL
  Filled 2017-02-02: qty 1

## 2017-02-02 NOTE — Discharge Summary (Signed)
Physician Discharge Summary  Patient ID: YANI LAL MRN: [id number] DOB/AGE: 1940/09/14  76 y.o.  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  Postoperatively, the patient had nausea & vomiting.  IVF & nausea control given.  He could not void - foley placed.   Dizzy - IVF & scopolamine patch given - improved.  No orthostasis by POD#2.  He quickly improved.  gradually mobilized and advanced to a solid diet.  Pain and other symptoms were treated aggressively.    By the time of discharge, the patient was walking well the hallways, eating food, having flatus.  Pain was well-controlled on an oral medications.  Based on meeting discharge criteria and continuing to recover, I felt it was safe for the patient to be discharged from the hospital to further recover with close followup. F/u [facility] office ofor foley removal next week.  D/w 5E RN as well.  Postoperative recommendations were discussed in detail.  They are written as well.  Discharged Condition: good  Disposition:  Follow-up Information    Michael Boston, MD. Schedule an appointment as soon as possible for a visit in 3 week(s).   Specialty:  General Surgery Why:  To follow up after your operation Contact information: [address] [place] [address] [phone]        [facility], [place]. Schedule an appointment as soon as possible for a visit on 02/05/2017.   Specialty:  General Surgery Why:  Come to [place] office Tuesday to have urine catheter removed Contact information: [address] [place] [phone]          01-Home or Self Care  Discharge Instructions    Call MD for:    Complete by:  As directed    FEVER >101.5 F (Temperatures <101.17F occasionally happen and are not significant)   Call MD for:    Complete by:  As directed    FEVER > 101.5 F  (temperatures < 101.5 F are not significant)   Call MD for:  extreme fatigue    Complete by:  As directed    Call MD for:  extreme fatigue    Complete by:  As directed    Call MD for:  persistant dizziness or light-headedness    Complete by:  As directed    Call MD for:  persistant dizziness or light-headedness    Complete by:  As directed    Call MD for:  persistant nausea and vomiting    Complete by:  As directed    Call MD for:  persistant nausea and vomiting    Complete by:  As directed    Call MD for:  redness, tenderness, or signs of infection (pain, swelling, redness, odor or green/yellow discharge around incision site)    Complete by:  As  directed    Call MD for:  redness, tenderness, or signs of infection (pain, swelling, redness, odor or green/yellow discharge around incision site)    Complete by:  As directed    Call MD for:  severe uncontrolled pain    Complete by:  As directed    Call MD for:  severe uncontrolled pain    Complete by:  As directed    Diet - low sodium heart healthy    Complete by:  As directed    Follow a light diet the first few days at home.  Start with a bland diet such as soups, liquids, starchy foods, low fat foods, etc.   If you feel full, bloated, or constipated, stay on a full liquid or pureed/blenderized diet for a few days until you feel  better and no longer constipated. Gradually get back to a regular solid diet.  Avoid fast food or heavy meals the first week as you are more likely to get nauseated.   Diet - low sodium heart healthy    Complete by:  As directed    Follow a light diet the first few days at home.   Start with a bland diet such as soups, liquids, starchy foods, low fat foods, etc.   If you feel full, bloated, or constipated, stay on a full liquid or pureed/blenderized diet for a few days until you feel better and no longer constipated. Be sure to drink plenty of fluids every day to avoid getting dehydrated (feeling dizzy, not urinating, etc.). Gradually add a fiber supplement to your diet   Discharge instructions    Complete by:  As directed    One the day of your discharge from the hospital (or the next business weekday), please call [facility] to set up or confirm an appointment to see your surgeon in the office for a follow-up appointment.  Usually it is 2-3 weeks after your surgery.  Other concerns If you are not getting better after two weeks or are noticing you are getting worse, contact our office [phone] for further advice.  We may need to adjust your medications, re-evaluate you in the office, send you to the emergency room, or see what other things we can do to help. The clinic staff is available to answer your questions during regular business hours (8:30am-5pm).  Please don't hesitate to call and ask to speak to one of our nurses for clinical concerns.    A surgeon from [facility] is always on call at the hospitals 24 hours/day If you have a medical emergency, go to the nearest emergency room or call 911.   Discharge instructions    Complete by:  As directed    See Discharge Instructions If you are not getting better after two weeks or are noticing you are getting worse, contact our office [phone] for further advice.  We may need to adjust your medications,  re-evaluate you in the office, send you to the emergency room, or see what other things we can do to help. The clinic staff is available to answer your questions during regular business hours (8:30am-5pm).  Please don't hesitate to call and ask to speak to one of our nurses for clinical concerns.    A surgeon from [facility] is always on call at the hospitals 24 hours/day If you have a medical emergency, go to the nearest emergency room or call 911.   Driving Restrictions    Complete by:  As directed    You may drive when you are no longer taking prescription pain medication, you can comfortably wear a seatbelt, and you can safely maneuver your car and apply brakes.   Driving Restrictions    Complete by:  As directed    You may drive when you are no longer taking narcotic prescription pain medication, you can comfortably wear a seatbelt, and you can safely make sudden turns/stops to protect yourself without hesitating due to pain.   Increase activity slowly    Complete by:  As directed    Increase activity slowly    Complete by:  As directed    Start light daily activities --- self-care, walking, climbing stairs- beginning the day after surgery.  Gradually increase activities as tolerated.  Control your pain to be active.  Stop when you are tired.  Ideally, walk several times a day, eventually an hour a day.   Most people are back to most day-to-day activities in a few weeks.  It takes 4-8 weeks to get back to unrestricted, intense activity. If you can walk 30 minutes without difficulty, it is safe to try more intense activity such as jogging, treadmill, bicycling, low-impact aerobics, swimming, etc. Save the most intensive and strenuous activity for last (Usually 4-8 weeks after surgery) such as sit-ups, heavy lifting, contact sports, etc.  Refrain from any intense heavy lifting or straining until you are off narcotics for pain control.  You will have off days, but things should  improve week-by-week. DO NOT PUSH THROUGH PAIN.  Let pain be your guide: If it hurts to do something, don't do it.  Pain is your body warning you to avoid that activity for another week until the pain goes down.   Lifting restrictions    Complete by:  As directed    You may resume regular (light) daily activities beginning the next day-such as daily self-care, walking, climbing stairs-gradually increasing activities as tolerated.   If you can walk 30 minutes without difficulty, it is safe to try more intense activity such as jogging, treadmill, bicycling, low-impact aerobics, swimming, etc. Save the most intensive and strenuous activity for last such as sit-ups, heavy lifting, contact sports, etc   Refrain from any heavy lifting or straining until you are off narcotics for pain control.   DO NOT PUSH THROUGH PAIN.   Let pain be your guide: If it hurts to do something, don't do it.   Pain is your body warning you to avoid that activity for another week until the pain goes down.   Lifting restrictions    Complete by:  As directed    If you can walk 30 minutes without difficulty, it is safe to try more intense activity such as jogging, treadmill, bicycling, low-impact aerobics, swimming, etc. Save the most intensive and strenuous activity for last (Usually 4-8 weeks after surgery) such as sit-ups, heavy lifting, contact sports, etc.  Refrain from any intense heavy lifting or straining until you are off narcotics for pain control.  You will have off days, but things should improve week-by-week. DO NOT PUSH THROUGH PAIN.  Let pain be your guide: If it hurts to do something, don't do it.  Pain is your body warning you to avoid that activity for another week until the pain goes down.   May shower / Bathe    Complete by:  As directed    Wash / shower every day.  You may shower over the dressings as they are waterproof.  Continue to shower over incision(s) after the dressing is off.   May walk up steps     Complete by:  As directed    May walk up steps    Complete by:  As directed    No wound care    Complete by:  As directed    It is good for closed incision and even open wounds to be washed every day.  Shower every day.  Short baths are fine.  Wash the incisions and wounds clean with soap & water.    If you have a closed incision(s), wash the incision with soap & water every day.  You may leave closed incisions open to air if it is dry.   You may cover the incision with clean gauze & replace it after your daily shower for comfort. If you have skin tapes (Steristrips) or skin glue (Dermabond) on your incision, leave them in place.  They will fall off on their own like a scab.  You may trim any edges that curl up with clean scissors.  If you have staples, set up an appointment for them to be removed in the office in 10 days after surgery.  If you have a drain, wash around the skin exit site with soap & water and place a new dressing of gauze or band aid around the skin every day.  Keep the drain site clean & dry.   Remove dressing in 72 hours    Complete by:  As directed    Remove your waterproof dressings, skin tapes, and other bandages 3-5 days after surgery.   You may leave the incision(s) open to air.   You may replace a dressing/Band-Aid to cover the incision for comfort if you wish.   Sexual Activity Restrictions    Complete by:  As directed    You may have sexual intercourse when it is comfortable. If it hurts to do something, stop.   Sexual Activity Restrictions    Complete by:  As directed    You may have sexual intercourse when it is comfortable. If it hurts to do something, stop.      Allergies as of 02/02/2017      Reactions   Arthrotec [diclofenac-misoprostol] Diarrhea      Medication List    STOP taking these medications   amLODipine 10 MG tablet Commonly known as:  NORVASC     TAKE these medications   aspirin EC 81 MG tablet Take 81 mg by mouth at bedtime.    multivitamin with minerals Tabs tablet Take 1 tablet by mouth daily.   pantoprazole 40 MG tablet Commonly known as:  PROTONIX Take 40 mg by mouth every morning.   sertraline 100 MG tablet Commonly known as:  ZOLOFT Take 100 mg by mouth at bedtime.   tamsulosin 0.4 MG Caps capsule Commonly known as:  FLOMAX Take 0.4 mg by mouth daily.   traMADol 50 MG tablet Commonly known as:  ULTRAM Take 1 tablet (50 mg total) by mouth every 6 (six) hours as needed for moderate pain. What changed:  when to take this            Discharge Care Instructions        Start     Ordered   02/02/17 0000  Discharge instructions    Comments:  See Discharge Instructions If you are not getting better after two weeks or are noticing you are getting worse, contact our office [phone] for further advice.  We may need to adjust your medications, re-evaluate you in the office, send you to the emergency room, or see what other things we can do to help. The clinic staff is available to answer your questions during regular business hours (8:30am-5pm).  Please don't hesitate to call and ask to speak to one of our nurses for clinical concerns.    A surgeon from [facility] is always on call at the hospitals 24 hours/day If you have a medical emergency, go to the nearest emergency room or call 911.   02/02/17 0929   02/02/17 0000  Diet - low sodium heart healthy    Comments:  Follow a light diet the first few days at home.   Start with a bland diet such as soups, liquids, starchy foods, low fat foods, etc.   If you feel full, bloated, or constipated, stay on a full liquid or pureed/blenderized diet for a few days until you feel better and no longer constipated. Be sure to drink plenty of fluids every day to avoid getting dehydrated (feeling dizzy, not urinating, etc.). Gradually add a fiber supplement to your diet   02/02/17 0929   02/02/17 0000  Increase activity slowly    Comments:  Start  light daily activities --- self-care, walking, climbing stairs- beginning the day after surgery.  Gradually increase activities as tolerated.  Control your pain to be active.  Stop when you are tired.  Ideally, walk several times a day, eventually an hour a day.   Most people are back to most day-to-day activities in a few weeks.  It takes 4-8 weeks to get back to unrestricted, intense activity. If you can walk 30 minutes without difficulty, it is safe to try more intense activity such as jogging, treadmill, bicycling, low-impact aerobics, swimming, etc. Save the most intensive and strenuous activity for last (Usually 4-8 weeks after surgery) such as sit-ups, heavy lifting, contact sports, etc.  Refrain from any intense heavy lifting or straining until you are off narcotics for pain control.  You will have off days, but things should improve week-by-week. DO NOT PUSH THROUGH PAIN.  Let pain be your guide: If it hurts to do something, don't do it.  Pain is your body warning you to avoid that activity for another week until the pain goes down.   02/02/17 0929   02/02/17 0000  May walk up steps     02/02/17 0929   02/02/17 0000  Driving Restrictions    Comments:  You may drive when you are no longer taking narcotic prescription pain medication, you can comfortably wear a seatbelt, and you can safely make sudden turns/stops to protect yourself without hesitating due to pain.   02/02/17 0929   02/02/17 0000  Lifting restrictions    Comments:  If you can walk 30 minutes without difficulty, it is safe to try more intense activity such as jogging, treadmill, bicycling, low-impact aerobics, swimming, etc. Save the most intensive and strenuous activity for last (Usually 4-8 weeks after surgery) such as sit-ups, heavy lifting, contact sports, etc.  Refrain from any intense heavy lifting or straining until you are off narcotics for pain control.  You will have off days, but things should improve week-by-week. DO  NOT PUSH THROUGH PAIN.  Let pain be your guide: If it hurts to do something, don't do it.  Pain is your body warning you to avoid that activity for another week until the pain goes down.   02/02/17 7824  02/02/17 0000  Sexual Activity Restrictions    Comments:  You may have sexual intercourse when it is comfortable. If it hurts to do something, stop.   02/02/17 0929   02/02/17 0000  No wound care    Comments:  It is good for closed incision and even open wounds to be washed every day.  Shower every day.  Short baths are fine.  Wash the incisions and wounds clean with soap & water.    If you have a closed incision(s), wash the incision with soap & water every day.  You may leave closed incisions open to air if it is dry.   You may cover the incision with clean gauze & replace it after your daily shower for comfort. If you have skin tapes (Steristrips) or skin glue (Dermabond) on your incision, leave them in place.  They will fall off on their own like a scab.  You may trim any edges that curl up with clean scissors.  If you have staples, set up an appointment for them to be removed in the office in 10 days after surgery.  If you have a drain, wash around the skin exit site with soap & water and place a new dressing of gauze or band aid around the skin every day.  Keep the drain site clean & dry.   02/02/17 0929   02/02/17 0000  Call MD for:    Comments:  FEVER > 101.5 F  (temperatures < 101.5 F are not significant)   02/02/17 0929   02/02/17 0000  Call MD for:  persistant nausea and vomiting     02/02/17 0929   02/02/17 0000  Call MD for:  severe uncontrolled pain     02/02/17 0929   02/02/17 0000  Call MD for:  redness, tenderness, or signs of infection (pain, swelling, redness, odor or green/yellow discharge around incision site)     02/02/17 0929   02/02/17 0000  Call MD for:  persistant dizziness or light-headedness     02/02/17 0929   02/02/17 0000  Call MD for:  extreme fatigue      02/02/17 0929   01/31/17 0000  traMADol (ULTRAM) 50 MG tablet  Every 6 hours PRN     01/31/17 1129   01/31/17 0000  Discharge instructions    Comments:  One the day of your discharge from the hospital (or the next business weekday), please call [facility] to set up or confirm an appointment to see your surgeon in the office for a follow-up appointment.  Usually it is 2-3 weeks after your surgery.  Other concerns If you are not getting better after two weeks or are noticing you are getting worse, contact our office [phone] for further advice.  We may need to adjust your medications, re-evaluate you in the office, send you to the emergency room, or see what other things we can do to help. The clinic staff is available to answer your questions during regular business hours (8:30am-5pm).  Please don't hesitate to call and ask to speak to one of our nurses for clinical concerns.    A surgeon from [facility] is always on call at the hospitals 24 hours/day If you have a medical emergency, go to the nearest emergency room or call 911.   01/31/17 1129   01/31/17 0000  Diet - low sodium heart healthy    Comments:  Follow a light diet the first few days at home.  Start  with a bland diet such as soups, liquids, starchy foods, low fat foods, etc.   If you feel full, bloated, or constipated, stay on a full liquid or pureed/blenderized diet for a few days until you feel better and no longer constipated. Gradually get back to a regular solid diet.  Avoid fast food or heavy meals the first week as you are more likely to get nauseated.   01/31/17 1129   01/31/17 0000  Increase activity slowly     01/31/17 1129   01/31/17 0000  May walk up steps     01/31/17 1129   01/31/17 0000  May shower / Bathe    Comments:  Wash / shower every day.  You may shower over the dressings as they are waterproof.  Continue to shower over incision(s) after the dressing is off.   01/31/17 1129    01/31/17 0000  Driving Restrictions    Comments:  You may drive when you are no longer taking prescription pain medication, you can comfortably wear a seatbelt, and you can safely maneuver your car and apply brakes.   01/31/17 1129   01/31/17 0000  Lifting restrictions    Comments:  You may resume regular (light) daily activities beginning the next day-such as daily self-care, walking, climbing stairs-gradually increasing activities as tolerated.   If you can walk 30 minutes without difficulty, it is safe to try more intense activity such as jogging, treadmill, bicycling, low-impact aerobics, swimming, etc. Save the most intensive and strenuous activity for last such as sit-ups, heavy lifting, contact sports, etc   Refrain from any heavy lifting or straining until you are off narcotics for pain control.   DO NOT PUSH THROUGH PAIN.   Let pain be your guide: If it hurts to do something, don't do it.   Pain is your body warning you to avoid that activity for another week until the pain goes down.   01/31/17 1129   01/31/17 0000  Sexual Activity Restrictions    Comments:  You may have sexual intercourse when it is comfortable. If it hurts to do something, stop.   01/31/17 1129   01/31/17 0000  Remove dressing in 72 hours    Comments:  Remove your waterproof dressings, skin tapes, and other bandages 3-5 days after surgery.   You may leave the incision(s) open to air.   You may replace a dressing/Band-Aid to cover the incision for comfort if you wish.   01/31/17 1129   01/31/17 0000  Call MD for:    Comments:  FEVER >101.5 F (Temperatures <101.92F occasionally happen and are not significant)   01/31/17 1129   01/31/17 0000  Call MD for:  persistant nausea and vomiting     01/31/17 1129   01/31/17 0000  Call MD for:  severe uncontrolled pain     01/31/17 1129   01/31/17 0000  Call MD for:  redness, tenderness, or signs of infection (pain, swelling, redness, odor or green/yellow discharge around  incision site)     01/31/17 1129   01/31/17 0000  Call MD for:  persistant dizziness or light-headedness     01/31/17 1129   01/31/17 0000  Call MD for:  extreme fatigue     01/31/17 1129      Significant Diagnostic Studies:  Results for orders placed or performed during the hospital encounter of 01/31/17 (from the past 72 hour(s))  CBC     Status: Abnormal   Collection Time: 02/01/17  5:41 AM  Result Value Ref Range   WBC 16.7 (H) 4.0 - 10.5 K/uL   RBC 4.20 (L) 4.22 - 5.81 MIL/uL   Hemoglobin 12.7 (L) 13.0 - 17.0 g/dL   HCT 36.8 (L) 39.0 - 52.0 %   MCV 87.6 78.0 - 100.0 fL   MCH 30.2 26.0 - 34.0 pg   MCHC 34.5 30.0 - 36.0 g/dL   RDW 13.4 11.5 - 15.5 %   Platelets 186 150 - 400 K/uL    Dg Chest Port 1 View  Result Date: 02/01/2017 CLINICAL DATA:  Emesis. EXAM: PORTABLE CHEST 1 VIEW COMPARISON:  05/17/2010. FINDINGS: Mediastinum hilar structures normal. Stable mild cardiomegaly with normal pulmonary vascularity. Low lung volumes with mild basilar atelectasis. Gastric distention noted. IMPRESSION: 1.  Stable mild cardiomegaly .  No pulmonary venous congestion. 2. Low lung volumes with bibasilar atelectasis. 3. Gastric distention . Electronically Signed   By: Marcello Moores  Register   On: 02/01/2017 06:26   Dg Abd Portable 1v  Result Date: 02/01/2017 CLINICAL DATA:  Vomiting for 2 days EXAM: PORTABLE ABDOMEN - 1 VIEW COMPARISON:  CT 12/04/2016 FINDINGS: Mottled appearing gas noted within the left lateral abdomen. This appears to extend peripheral to the lower left ribs compatible with subcutaneous emphysema. Nonobstructive bowel gas pattern. No free air. Prior cholecystectomy. The upper abdomen is not imaged on this study but visualized on the chest x-ray performed earlier today. There appears to be mild gaseous distention of the stomach. Prior right hip replacement.  No acute bony abnormality. IMPRESSION: Mottled gas in the lateral left abdomen, likely abdominal wall compatible with  subcutaneous emphysema. This is of unknown etiology. Consider further evaluation with CT of the abdomen and pelvis with IV contrast. Mild gaseous distention of the stomach. Prior cholecystectomy. Electronically Signed   By: Rolm Baptise M.D.   On: 02/01/2017 09:02    Discharge Exam: Blood pressure 103/63, pulse 60, temperature 98.2 F (36.8 C), temperature source Oral, resp. rate 18, SpO2 93 %.  General: Pt awake/alert/oriented x4 in No acute distress Eyes: PERRL, normal EOM.  Sclera clear.  No icterus Neuro: CN II-XII intact w/o focal sensory/motor deficits. Lymph: No head/neck/groin lymphadenopathy Psych:  No delerium/psychosis/paranoia HENT: Normocephalic, Mucus membranes moist.  No thrush Neck: Supple, No tracheal deviation Chest: No chest wall pain w good excursion CV:  Pulses intact.  Regular rhythm MS: Normal AROM mjr joints.  No obvious deformity Abdomen: Soft.  Nondistended.  Nontender.  No evidence of peritonitis.  No incarcerated hernias. Ext:  SCDs BLE.  No mjr edema.  No cyanosis Skin: No petechiae / purpura  Past Medical History:  Diagnosis Date  . Anxiety   . Arthritis    Osteoarthritis-left hip, s/p arthroplasty knees, right hip  . Cancer ([place])    basal and squamous cell cancer lesion  . Cancer [facility]) 2002   prostate  . Depression   . GERD (gastroesophageal reflux disease)   . History of kidney stones    x1 passed on own  . Hypertension   . Neuromuscular disorder ([facility])    restless leg syndrome  . Sleep apnea    no cpap used    Past Surgical History:  Procedure Laterality Date  . 4 titanium posts for denture placement    . BUNIONECTOMY Bilateral 08/02/2012  . CATARACT EXTRACTION Left 2003  . CATARACT EXTRACTION W/PHACO Right 08/23/2015   Procedure: CATARACT EXTRACTION PHACO AND INTRAOCULAR LENS PLACEMENT (IOC);  Surgeon: Birder Robson, MD;  Location: [facility] ORS;  Service: Ophthalmology;  Laterality: Right;  [place] 1:00 AP% 35.2 CDE 10.11 fluid pack lot #  [id number]  . CHOLECYSTECTOMY     "stones"- Laparoscopic  . ERCP N/A 02/18/2013   Procedure: ENDOSCOPIC RETROGRADE CHOLANGIOPANCREATOGRAPHY (ERCP);  Surgeon: Arta Silence, MD;  Location: [facility];  Service: Endoscopy;  Laterality: N/A;  . EUS N/A 02/18/2013   Procedure: ESOPHAGEAL ENDOSCOPIC ULTRASOUND (EUS) RADIAL;  Surgeon: Arta Silence, MD;  Location: [facility];  Service: Endoscopy;  Laterality: N/A;  . EYE SURGERY    . HAMMER TOE SURGERY Left    and bunionectomy  . HAND ARTHROTOMY Right    thumb surgery  . HIP ARTHROPLASTY Right   . INGUINAL HERNIA REPAIR N/A 01/31/2017   Procedure: LAPAROSCOPIC EXPLORATION AND REPAIR OF BILATERAL INGUINAL HERNIAS;  Surgeon: Michael Boston, MD;  Location: [facility];  Service: General;  Laterality: N/A;  With MESH  . JOINT REPLACEMENT    . KNEE ARTHROPLASTY Bilateral 2000  . LAPAROSCOPIC APPENDECTOMY N/A 11/03/2013   Procedure: APPENDECTOMY LAPAROSCOPIC/ PRIMARY INCISIONAL HERNIA REPAIR;  Surgeon: Rolm Bookbinder, MD;  Location: [facility];  Service: General;  Laterality: N/A;  . TRANSURETHRAL RESECTION OF PROSTATE      Social History   Social History  . Marital status: Married    Spouse name: N/A  . Number of children: N/A  . Years of education: N/A   Occupational History  . Not on file.   Social History Main Topics  . Smoking status: Former Smoker    Packs/day: 1.00    Years: 12.00    Quit date: 06/04/1982  . Smokeless tobacco: Never Used     Comment: no passive smokers in home  . Alcohol use Yes     Comment: rare   . Drug use: No  . Sexual activity: Yes   Other Topics Concern  . Not on file   Social History Narrative  . No narrative on file    Family History  Problem Relation Age of Onset  . Diabetes Mellitus II Neg Hx   . CAD Neg Hx     Current Facility-Administered Medications  Medication Dose Route Frequency Provider Last Rate Last Dose  . acetaminophen (TYLENOL) tablet 1,000 mg  1,000 mg Oral TID Michael Boston, MD    1,000 mg at 02/01/17 2108  . alum & mag hydroxide-simeth (MAALOX/MYLANTA) 200-200-20 MG/5ML suspension 30 mL  30 mL Oral Q6H PRN Michael Boston, MD      . aspirin EC tablet 81 mg  81 mg Oral Ardeen Fillers, MD      . bisacodyl (DULCOLAX) suppository 10 mg  10 mg Rectal Q12H PRN Michael Boston, MD      . diphenhydrAMINE (BENADRYL) 12.5 MG/5ML elixir 12.5 mg  12.5 mg Oral X3A PRN Leighton Ruff, MD       Or  . diphenhydrAMINE (BENADRYL) injection 12.5 mg  12.5 mg Intravenous T5T PRN Leighton Ruff, MD      . enoxaparin (LOVENOX) injection 40 mg  40 mg Subcutaneous D32K Leighton Ruff, MD   40 mg at 02/02/17 0908  . fentaNYL (SUBLIMAZE) injection 25-50 mcg  25-50 mcg Intravenous Q1H PRN Michael Boston, MD      . gabapentin (NEURONTIN) capsule 300 mg  300 mg Oral BID Leighton Ruff, MD   025 mg at 02/01/17 2108  . guaiFENesin-dextromethorphan (ROBITUSSIN DM) 100-10 MG/5ML syrup 10 mL  10 mL Oral Q4H PRN Michael Boston, MD      . hydrocortisone (ANUSOL-HC) 2.5 % rectal cream 1 application  1 application Topical QID PRN Sheyla Zaffino,  Remo Lipps, MD      . hydrocortisone cream 1 % 1 application  1 application Topical TID PRN Michael Boston, MD      . lactated ringers bolus 1,000 mL  1,000 mL Intravenous Q8H PRN Michael Boston, MD   1,000 mL at 02/01/17 0854  . lip balm (CARMEX) ointment 1 application  1 application Topical BID Michael Boston, MD   1 application at 44/92/01 2108  . magic mouthwash  15 mL Oral QID PRN Michael Boston, MD      . menthol-cetylpyridinium (CEPACOL) lozenge 3 mg  1 lozenge Oral PRN Michael Boston, MD      . methocarbamol (ROBAXIN) 1,000 mg in dextrose 5 % 50 mL IVPB  1,000 mg Intravenous Q6H PRN Michael Boston, MD      . metoCLOPramide (REGLAN) injection 5-10 mg  5-10 mg Intravenous Q6H PRN Michael Boston, MD      . metoprolol tartrate (LOPRESSOR) injection 5 mg  5 mg Intravenous Q6H PRN Michael Boston, MD      . multivitamin with minerals tablet 1 tablet  1 tablet Oral Daily Michael Boston, MD       . ondansetron [facility]) injection 4 mg  4 mg Intravenous Lajuana Ripple, MD   4 mg at 02/02/17 0908  . pantoprazole (PROTONIX) EC tablet 40 mg  40 mg Oral Daily Michael Boston, MD      . phenol (CHLORASEPTIC) mouth spray 1-2 spray  1-2 spray Mouth/Throat PRN Michael Boston, MD      . prochlorperazine (COMPAZINE) injection 5-10 mg  5-10 mg Intravenous Q4H PRN Michael Boston, MD      . saccharomyces boulardii (FLORASTOR) capsule 250 mg  250 mg Oral BID Michael Boston, MD   250 mg at 02/01/17 2109  . scopolamine (TRANSDERM-SCOP) 1 MG/3DAYS 1.5 mg  1 patch Transdermal Cyndia Bent, MD   1.5 mg at 02/01/17 1106  . sertraline (ZOLOFT) tablet 100 mg  100 mg Oral QHS Leighton Ruff, MD   007 mg at 02/01/17 2109  . simethicone (MYLICON) 40 HQ/1.9XJ suspension 40 mg  40 mg Oral QID PRN Michael Boston, MD      . tamsulosin [facility]) capsule 0.4 mg  0.4 mg Oral Daily Leighton Ruff, MD   0.4 mg at 02/01/17 0945  . traMADol (ULTRAM) tablet 50-100 mg  50-100 mg Oral Q6H PRN Michael Boston, MD         Allergies  Allergen Reactions  . Arthrotec [Diclofenac-Misoprostol] Diarrhea    Signed: Morton Peters, M.D., F.A.C.S. Gastrointestinal and Minimally Invasive Surgery [facility] [address] [place], [place] [address] [phone] Main / Paging   02/02/2017, 9:30 AM

## 2017-02-02 NOTE — Progress Notes (Signed)
Patient is being discharge home. Discharge instructions were given to patient and family 

## 2017-02-02 NOTE — Discharge Instructions (Signed)
Come to [place] office on Tuesday 02/05/2017 to have the urine catheter removed  Hold your amlodipine for 1 week.  Restart it after finishing your tamsulosin next week     Indwelling Urinary Catheter Care, Adult Taking good care of your catheter will keep it working properly and help prevent problems from developing. How to wear your catheter Attach your catheter to your leg with adhesive tape or a leg strap. Make sure there is no tension on the catheter. If you use adhesive tape, first remove any sticky residue from the previous tape you used. How to wear a drainage bag  You should have received a large overnight drainage bag and a smaller leg bag that fits underneath clothing. You may wear the overnight bag at any time, but you should never wear the smaller leg bag at night.  Always keep the overnight drainage bag below the level of your bladder, but keep it off the floor. When you sleep, hang the bag inside a wastebasket that is covered by a clean plastic bag.  Always wear the leg bag below your knee. Keep the leg bag secure with a leg strap or adhesive tape. How to care for your skin  Clean the skin around the catheter at least once every day.  Shower every day. Do not take baths.  Apply creams, lotions, or ointments to your genital area only as told by your health care provider.  Do not use powders, sprays, or lotions on your genital area. How to clean your catheter and your skin 1. Wash your hands with soap and water. 2. Wet a washcloth in warm water and mild soap. 3. Use the washcloth to clean the skin where the catheter enters your body. Clean downward, wiping away from the catheter in small circles. Do not wipe toward the catheter. This can push bacteria into the urethra and cause infection. 4. Pat the area dry with a clean towel. Make sure to remove all soap. How to care for your drainage bags Empty your drainage bag when it is ?- full, or at least 2-3 times a day. Replace your  drainage bag once a month or sooner if it starts to smell bad or look dirty. Do not clean your drainage bag unless told by your health care provider. Emptying a drainage bag  Supplies Needed  Rubbing alcohol.  Gauze pad or cotton ball.  Adhesive tape or a leg strap.  Steps 1. Wash your hands with soap and water. 2. Detach the drainage bag from your leg. 3. Hold the drainage bag over the toilet or a clean container. Keep the drainage bag below your hips and bladder. This stops urine from going back into the tubing and into your bladder. 4. Open the pour spout at the bottom of the bag. 5. Empty the urine into the toilet or container. Do not let the pour spout touch any surface. This helps keep bacteria out of the bag and helps prevent infection. 6. Apply rubbing alcohol to a gauze pad or cotton ball. 7. Use the gauze pad or cotton ball to clean the pour spout. 8. Close the pour spout. 9. Attach the bag to your leg with adhesive tape or a leg strap. 10. Wash your hands.  Changing a drainage bag Supplies Needed  Alcohol wipes.  A clean drainage bag.  Adhesive tape or a leg strap.  Steps 1. Wash your hands with soap and water. 2. Detach the dirty drainage bag from your leg. 3. Pinch the rubber  catheter with your fingers so that urine does not spill out. 4. Disconnect the catheter tube from the drainage tube at the connection valve. Do not let the tubes touch any surface. 5. Clean the end of the catheter tube with an alcohol wipe. Use a different alcohol wipe to clean the end of the drainage tube. 6. Connect the catheter tube to the drainage tube of the clean drainage bag. 7. Attach the new bag to your leg with adhesive tape or a leg strap. Avoid attaching the new bag too tightly. 8. Wash your hands.  How to prevent infection and other problems  Never pull on your catheter or try to remove it. Pulling can damage your internal tissues.  Always wash your hands before and after  handling your catheter.  If a leg strap gets wet, replace it with a dry one.  Drink enough fluids to keep your urine clear or pale yellow, or as told by your health care provider.  Do not let the drainage bag or tubing touch the floor.  Wear cotton underwear. Cotton absorbs moisture and keeps your skin dry.  If you are male, wipe from front to back after each bowel movement.  Check the catheter often to make sure that it works properly and the tubing is not twisted or curled. Contact a health care provider if:  Your urine is cloudy.  Your urine smells unusually bad.  Your urine is not draining into the bag.  Your catheter gets clogged.  Your catheter starts to leak.  Your bladder feels full. Get help right away if:  You have redness, swelling, or pain where the catheter enters your body.  You have fluid, pus, or a bad smell coming from the area where the catheter enters your body.  The area where the catheter enters your body feels warm to the touch.  You have a fever.  You have pain in your abdomen, legs, lower back, or bladder.  You see blood fill the catheter.  Your urine is pink or red.  You have nausea, vomiting, or chills.  Your catheter gets pulled out. This information is not intended to replace advice given to you by your health care provider. Make sure you discuss any questions you have with your health care provider. Document Released: 05/21/2005 Document Revised: 04/18/2016 Document Reviewed: 11/03/2013 Elsevier Interactive Patient Education  2018 [place]: POST OP INSTRUCTIONS  ######################################################################  EAT Gradually transition to a high fiber diet with a fiber supplement over the next few weeks after discharge.  Start with a pureed / full liquid diet (see below)  WALK Walk an hour a day.  Control your pain to do that.    CONTROL PAIN Control pain so that you can walk,  sleep, tolerate sneezing/coughing, go up/down stairs.  HAVE A BOWEL MOVEMENT DAILY Keep your bowels regular to avoid problems.  OK to try a laxative to override constipation.  OK to use an antidairrheal to slow down diarrhea.  Call if not better after 2 tries  CALL IF YOU HAVE PROBLEMS/CONCERNS Call if you are still struggling despite following these instructions. Call if you have concerns not answered by these instructions  ######################################################################    1. DIET: Follow a light bland diet the first 24 hours after arrival home, such as soup, liquids, crackers, etc.  Be sure to include lots of fluids daily.  Avoid fast food or heavy meals as your are more likely to get nauseated.  Eat a low  fat the next few days after surgery. 2. Take your usually prescribed home medications unless otherwise directed. 3. PAIN CONTROL: a. Pain is best controlled by a usual combination of three different methods TOGETHER: i. Ice/Heat ii. Over the counter pain medication iii. Prescription pain medication b. Most patients will experience some swelling and bruising around the hernia(s) such as the bellybutton, groins, or old incisions.  Ice packs or heating pads (30-60 minutes up to 6 times a day) will help. Use ice for the first few days to help decrease swelling and bruising, then switch to heat to help relax tight/sore spots and speed recovery.  Some people prefer to use ice alone, heat alone, alternating between ice & heat.  Experiment to what works for you.  Swelling and bruising can take several weeks to resolve.   c. It is helpful to take an over-the-counter pain medication regularly for the first few weeks.  Choose one of the following that works best for you: i. Naproxen (Aleve, etc)  Two 220mg  tabs twice a day ii. Ibuprofen (Advil, etc) Three 200mg  tabs four times a day (every meal & bedtime) iii. Acetaminophen (Tylenol, etc) 325-650mg  four times a day (every  meal & bedtime) d. A  prescription for pain medication should be given to you upon discharge.  Take your pain medication as prescribed.  i. If you are having problems/concerns with the prescription medicine (does not control pain, nausea, vomiting, rash, itching, etc), please call us [phone] to see if we need to switch you to a different pain medicine that will work better for you and/or control your side effect better. ii. If you need a refill on your pain medication, please contact your pharmacy.  They will contact our office to request authorization. Prescriptions will not be filled after 5 pm or on week-ends. 4. Avoid getting constipated.  Between the surgery and the pain medications, it is common to experience some constipation.  Increasing fluid intake and taking a fiber supplement (such as Metamucil, Citrucel, FiberCon, MiraLax, etc) 1-2 times a day regularly will usually help prevent this problem from occurring.  A mild laxative (prune juice, Milk of Magnesia, MiraLax, etc) should be taken according to package directions if there are no bowel movements after 48 hours.   5. Wash / shower every day.  You may shower over the dressings as they are waterproof.   6. Remove your waterproof bandages 5 days after surgery.  You may leave the incision open to air.  You may replace a dressing/Band-Aid to cover the incision for comfort if you wish.  Continue to shower over incision(s) after the dressing is off.    7. ACTIVITIES as tolerated:   a. You may resume regular (light) daily activities beginning the next day--such as daily self-care, walking, climbing stairs--gradually increasing activities as tolerated.  If you can walk 30 minutes without difficulty, it is safe to try more intense activity such as jogging, treadmill, bicycling, low-impact aerobics, swimming, etc. b. Save the most intensive and strenuous activity for last such as sit-ups, heavy lifting, contact sports, etc  Refrain from any  heavy lifting or straining until you are off narcotics for pain control.   c. DO NOT PUSH THROUGH PAIN.  Let pain be your guide: If it hurts to do something, don't do it.  Pain is your body warning you to avoid that activity for another week until the pain goes down. d. You may drive when you are no longer taking prescription pain medication,  you can comfortably wear a seatbelt, and you can safely maneuver your car and apply brakes. e. Dennis Bast may have sexual intercourse when it is comfortable.  8. FOLLOW UP in our office a. Please call [facility] at [phone] to set up an appointment to see your surgeon in the office for a follow-up appointment approximately 2-3 weeks after your surgery. b. Make sure that you call for this appointment the day you arrive home to insure a convenient appointment time. 9.  IF YOU HAVE DISABILITY OR FAMILY LEAVE FORMS, BRING THEM TO THE OFFICE FOR PROCESSING.  DO NOT GIVE THEM TO YOUR DOCTOR.  WHEN TO CALL us [phone]: 1. Poor pain control 2. Reactions / problems with new medications (rash/itching, nausea, etc)  3. Fever over 101.5 F (38.5 C) 4. Inability to urinate 5. Nausea and/or vomiting 6. Worsening swelling or bruising 7. Continued bleeding from incision. 8. Increased pain, redness, or drainage from the incision   The clinic staff is available to answer your questions during regular business hours (8:30am-5pm).  Please dont hesitate to call and ask to speak to one of our nurses for clinical concerns.   If you have a medical emergency, go to the nearest emergency room or call 911.  A surgeon from [facility] is always on call at the hospitals in [facility], [place], [place], [place], [place]  [address] ?  [address] [address], [place], [place]   [address] MAIN: [phone] ? TOLL FREE: [phone] ? FAX: [phone] [email or web address]      General Anesthesia, Adult, Care After These  instructions provide you with information about caring for yourself after your procedure. Your health care provider may also give you more specific instructions. Your treatment has been planned according to current medical practices, but problems sometimes occur. Call your health care provider if you have any problems or questions after your procedure. What can I expect after the procedure? After the procedure, it is common to have:  Vomiting.  A sore throat.  Mental slowness.  It is common to feel:  Nauseous.  Cold or shivery.  Sleepy.  Tired.  Sore or achy, even in parts of your body where you did not have surgery.  Follow these instructions at home: For at least 24 hours after the procedure:  Do not: ? Participate in activities where you could fall or become injured. ? Drive. ? Use heavy machinery. ? Drink alcohol. ? Take sleeping pills or medicines that cause drowsiness. ? Make important decisions or sign legal documents. ? Take care of children on your own.  Rest. Eating and drinking  If you vomit, drink water, juice, or soup when you can drink without vomiting.  Drink enough fluid to keep your urine clear or pale yellow.  Make sure you have little or no nausea before eating solid foods.  Follow the diet recommended by your health care provider. General instructions  Have a responsible adult stay with you until you are awake and alert.  Return to your normal activities as told by your health care provider. Ask your health care provider what activities are safe for you.  Take over-the-counter and prescription medicines only as told by your health care provider.  If you smoke, do not smoke without supervision.  Keep all follow-up visits as told by your health care provider. This is important. Contact a health care provider if:  You continue to have nausea or vomiting at home, and medicines  are not helpful.  You cannot drink fluids or start eating  again.  You cannot urinate after 8-12 hours.  You develop a skin rash.  You have fever.  You have increasing redness at the site of your procedure. Get help right away if:  You have difficulty breathing.  You have chest pain.  You have unexpected bleeding.  You feel that you are having a life-threatening or urgent problem. This information is not intended to replace advice given to you by your health care provider. Make sure you discuss any questions you have with your health care provider. Document Released: 08/27/2000 Document Revised: 10/24/2015 Document Reviewed: 05/05/2015 Elsevier Interactive Patient Education  [employer].

## 2017-03-15 DIAGNOSIS — M109 Gout, unspecified: Secondary | ICD-10-CM | POA: Diagnosis not present

## 2017-03-15 DIAGNOSIS — I10 Essential (primary) hypertension: Secondary | ICD-10-CM | POA: Diagnosis not present

## 2017-03-22 DIAGNOSIS — I10 Essential (primary) hypertension: Secondary | ICD-10-CM | POA: Diagnosis not present

## 2017-03-22 DIAGNOSIS — N400 Enlarged prostate without lower urinary tract symptoms: Secondary | ICD-10-CM | POA: Diagnosis not present

## 2017-03-22 DIAGNOSIS — Z23 Encounter for immunization: Secondary | ICD-10-CM | POA: Diagnosis not present

## 2017-03-22 DIAGNOSIS — M109 Gout, unspecified: Secondary | ICD-10-CM | POA: Diagnosis not present

## 2017-04-10 DIAGNOSIS — Z961 Presence of intraocular lens: Secondary | ICD-10-CM | POA: Diagnosis not present

## 2017-04-10 DIAGNOSIS — Z0100 Encounter for examination of eyes and vision without abnormal findings: Secondary | ICD-10-CM | POA: Diagnosis not present

## 2017-07-26 DIAGNOSIS — Z86010 Personal history of colonic polyps: Secondary | ICD-10-CM | POA: Diagnosis not present

## 2017-09-27 DIAGNOSIS — I10 Essential (primary) hypertension: Secondary | ICD-10-CM | POA: Diagnosis not present

## 2017-09-27 DIAGNOSIS — N400 Enlarged prostate without lower urinary tract symptoms: Secondary | ICD-10-CM | POA: Diagnosis not present

## 2017-09-27 DIAGNOSIS — M109 Gout, unspecified: Secondary | ICD-10-CM | POA: Diagnosis not present

## 2017-10-04 DIAGNOSIS — M545 Low back pain: Secondary | ICD-10-CM | POA: Diagnosis not present

## 2017-10-04 DIAGNOSIS — Z0000 Encounter for general adult medical examination without abnormal findings: Secondary | ICD-10-CM | POA: Diagnosis not present

## 2017-10-04 DIAGNOSIS — M109 Gout, unspecified: Secondary | ICD-10-CM | POA: Diagnosis not present

## 2017-10-04 DIAGNOSIS — Z23 Encounter for immunization: Secondary | ICD-10-CM | POA: Diagnosis not present

## 2017-10-04 DIAGNOSIS — R7301 Impaired fasting glucose: Secondary | ICD-10-CM | POA: Diagnosis not present

## 2017-10-04 DIAGNOSIS — I10 Essential (primary) hypertension: Secondary | ICD-10-CM | POA: Diagnosis not present

## 2017-10-04 DIAGNOSIS — E7800 Pure hypercholesterolemia, unspecified: Secondary | ICD-10-CM | POA: Diagnosis not present

## 2017-10-08 DIAGNOSIS — R69 Illness, unspecified: Secondary | ICD-10-CM | POA: Diagnosis not present

## 2017-10-14 DIAGNOSIS — R1032 Left lower quadrant pain: Secondary | ICD-10-CM | POA: Diagnosis not present

## 2017-10-15 DIAGNOSIS — R69 Illness, unspecified: Secondary | ICD-10-CM | POA: Diagnosis not present

## 2017-11-26 DIAGNOSIS — R69 Illness, unspecified: Secondary | ICD-10-CM | POA: Diagnosis not present

## 2017-12-27 DIAGNOSIS — R69 Illness, unspecified: Secondary | ICD-10-CM | POA: Diagnosis not present

## 2018-01-01 DIAGNOSIS — R69 Illness, unspecified: Secondary | ICD-10-CM | POA: Diagnosis not present

## 2018-01-02 DIAGNOSIS — L570 Actinic keratosis: Secondary | ICD-10-CM | POA: Diagnosis not present

## 2018-01-02 DIAGNOSIS — L821 Other seborrheic keratosis: Secondary | ICD-10-CM | POA: Diagnosis not present

## 2018-01-02 DIAGNOSIS — D485 Neoplasm of uncertain behavior of skin: Secondary | ICD-10-CM | POA: Diagnosis not present

## 2018-01-02 DIAGNOSIS — D045 Carcinoma in situ of skin of trunk: Secondary | ICD-10-CM | POA: Diagnosis not present

## 2018-01-02 DIAGNOSIS — L281 Prurigo nodularis: Secondary | ICD-10-CM | POA: Diagnosis not present

## 2018-01-02 DIAGNOSIS — L820 Inflamed seborrheic keratosis: Secondary | ICD-10-CM | POA: Diagnosis not present

## 2018-01-02 DIAGNOSIS — Z85828 Personal history of other malignant neoplasm of skin: Secondary | ICD-10-CM | POA: Diagnosis not present

## 2018-02-18 DIAGNOSIS — Z23 Encounter for immunization: Secondary | ICD-10-CM | POA: Diagnosis not present

## 2018-04-04 DIAGNOSIS — R7301 Impaired fasting glucose: Secondary | ICD-10-CM | POA: Diagnosis not present

## 2018-04-11 DIAGNOSIS — M5030 Other cervical disc degeneration, unspecified cervical region: Secondary | ICD-10-CM | POA: Diagnosis not present

## 2018-04-11 DIAGNOSIS — R739 Hyperglycemia, unspecified: Secondary | ICD-10-CM | POA: Diagnosis not present

## 2018-04-11 DIAGNOSIS — E7800 Pure hypercholesterolemia, unspecified: Secondary | ICD-10-CM | POA: Diagnosis not present

## 2018-04-11 DIAGNOSIS — I10 Essential (primary) hypertension: Secondary | ICD-10-CM | POA: Diagnosis not present

## 2018-04-11 DIAGNOSIS — K219 Gastro-esophageal reflux disease without esophagitis: Secondary | ICD-10-CM | POA: Diagnosis not present

## 2018-05-19 DIAGNOSIS — M7022 Olecranon bursitis, left elbow: Secondary | ICD-10-CM | POA: Diagnosis not present

## 2018-08-13 DIAGNOSIS — Z86010 Personal history of colonic polyps: Secondary | ICD-10-CM | POA: Diagnosis not present

## 2018-12-08 DIAGNOSIS — R739 Hyperglycemia, unspecified: Secondary | ICD-10-CM | POA: Diagnosis not present

## 2018-12-08 DIAGNOSIS — I10 Essential (primary) hypertension: Secondary | ICD-10-CM | POA: Diagnosis not present

## 2018-12-08 DIAGNOSIS — E7800 Pure hypercholesterolemia, unspecified: Secondary | ICD-10-CM | POA: Diagnosis not present

## 2018-12-15 DIAGNOSIS — N400 Enlarged prostate without lower urinary tract symptoms: Secondary | ICD-10-CM | POA: Diagnosis not present

## 2018-12-15 DIAGNOSIS — R350 Frequency of micturition: Secondary | ICD-10-CM | POA: Diagnosis not present

## 2018-12-15 DIAGNOSIS — I10 Essential (primary) hypertension: Secondary | ICD-10-CM | POA: Diagnosis not present

## 2018-12-15 DIAGNOSIS — K219 Gastro-esophageal reflux disease without esophagitis: Secondary | ICD-10-CM | POA: Diagnosis not present

## 2019-01-14 DIAGNOSIS — R7301 Impaired fasting glucose: Secondary | ICD-10-CM | POA: Diagnosis not present

## 2019-01-14 DIAGNOSIS — I10 Essential (primary) hypertension: Secondary | ICD-10-CM | POA: Diagnosis not present

## 2019-01-14 DIAGNOSIS — R350 Frequency of micturition: Secondary | ICD-10-CM | POA: Diagnosis not present

## 2019-01-20 DIAGNOSIS — L570 Actinic keratosis: Secondary | ICD-10-CM | POA: Diagnosis not present

## 2019-01-20 DIAGNOSIS — L438 Other lichen planus: Secondary | ICD-10-CM | POA: Diagnosis not present

## 2019-01-20 DIAGNOSIS — Z85828 Personal history of other malignant neoplasm of skin: Secondary | ICD-10-CM | POA: Diagnosis not present

## 2019-01-20 DIAGNOSIS — L820 Inflamed seborrheic keratosis: Secondary | ICD-10-CM | POA: Diagnosis not present

## 2019-01-20 DIAGNOSIS — D692 Other nonthrombocytopenic purpura: Secondary | ICD-10-CM | POA: Diagnosis not present

## 2019-01-20 DIAGNOSIS — L821 Other seborrheic keratosis: Secondary | ICD-10-CM | POA: Diagnosis not present

## 2019-03-09 DIAGNOSIS — R7301 Impaired fasting glucose: Secondary | ICD-10-CM | POA: Diagnosis not present

## 2019-03-09 DIAGNOSIS — I10 Essential (primary) hypertension: Secondary | ICD-10-CM | POA: Diagnosis not present

## 2019-03-16 DIAGNOSIS — R739 Hyperglycemia, unspecified: Secondary | ICD-10-CM | POA: Diagnosis not present

## 2019-03-16 DIAGNOSIS — Z23 Encounter for immunization: Secondary | ICD-10-CM | POA: Diagnosis not present

## 2019-03-16 DIAGNOSIS — K219 Gastro-esophageal reflux disease without esophagitis: Secondary | ICD-10-CM | POA: Diagnosis not present

## 2019-03-16 DIAGNOSIS — I10 Essential (primary) hypertension: Secondary | ICD-10-CM | POA: Diagnosis not present

## 2019-03-16 DIAGNOSIS — M109 Gout, unspecified: Secondary | ICD-10-CM | POA: Diagnosis not present

## 2019-04-17 DIAGNOSIS — R109 Unspecified abdominal pain: Secondary | ICD-10-CM | POA: Diagnosis not present

## 2019-04-27 ENCOUNTER — Other Ambulatory Visit: Payer: Self-pay | Admitting: Internal Medicine

## 2019-04-27 DIAGNOSIS — R109 Unspecified abdominal pain: Secondary | ICD-10-CM

## 2019-05-07 ENCOUNTER — Ambulatory Visit
Admission: RE | Admit: 2019-05-07 | Discharge: 2019-05-07 | Disposition: A | Discharge status: Home / Self Care | Payer: Medicare HMO | Source: Ambulatory Visit | Attending: Internal Medicine | Admitting: Internal Medicine

## 2019-05-07 ENCOUNTER — Other Ambulatory Visit: Payer: Self-pay

## 2019-05-07 DIAGNOSIS — R109 Unspecified abdominal pain: Secondary | ICD-10-CM

## 2019-05-07 DIAGNOSIS — K5730 Diverticulosis of large intestine without perforation or abscess without bleeding: Secondary | ICD-10-CM | POA: Diagnosis not present

## 2019-05-07 MED ORDER — IOPAMIDOL (ISOVUE-300) INJECTION 61%
100.0000 mL | Freq: Once | INTRAVENOUS | Status: AC | PRN
  Administered 2019-05-07: 100 mL via INTRAVENOUS

## 2019-05-14 DIAGNOSIS — M545 Low back pain: Secondary | ICD-10-CM | POA: Diagnosis not present

## 2019-05-14 DIAGNOSIS — R109 Unspecified abdominal pain: Secondary | ICD-10-CM | POA: Diagnosis not present

## 2019-06-12 DIAGNOSIS — M542 Cervicalgia: Secondary | ICD-10-CM | POA: Diagnosis not present

## 2019-06-12 DIAGNOSIS — M9903 Segmental and somatic dysfunction of lumbar region: Secondary | ICD-10-CM | POA: Diagnosis not present

## 2019-06-12 DIAGNOSIS — M9901 Segmental and somatic dysfunction of cervical region: Secondary | ICD-10-CM | POA: Diagnosis not present

## 2019-06-12 DIAGNOSIS — M9902 Segmental and somatic dysfunction of thoracic region: Secondary | ICD-10-CM | POA: Diagnosis not present

## 2019-06-12 DIAGNOSIS — M62838 Other muscle spasm: Secondary | ICD-10-CM | POA: Diagnosis not present

## 2019-06-12 DIAGNOSIS — M545 Low back pain: Secondary | ICD-10-CM | POA: Diagnosis not present

## 2019-06-15 DIAGNOSIS — M542 Cervicalgia: Secondary | ICD-10-CM | POA: Diagnosis not present

## 2019-06-15 DIAGNOSIS — M62838 Other muscle spasm: Secondary | ICD-10-CM | POA: Diagnosis not present

## 2019-06-15 DIAGNOSIS — M9901 Segmental and somatic dysfunction of cervical region: Secondary | ICD-10-CM | POA: Diagnosis not present

## 2019-06-15 DIAGNOSIS — M9902 Segmental and somatic dysfunction of thoracic region: Secondary | ICD-10-CM | POA: Diagnosis not present

## 2019-06-15 DIAGNOSIS — M9903 Segmental and somatic dysfunction of lumbar region: Secondary | ICD-10-CM | POA: Diagnosis not present

## 2019-06-15 DIAGNOSIS — M545 Low back pain: Secondary | ICD-10-CM | POA: Diagnosis not present

## 2019-06-17 DIAGNOSIS — M542 Cervicalgia: Secondary | ICD-10-CM | POA: Diagnosis not present

## 2019-06-17 DIAGNOSIS — M9903 Segmental and somatic dysfunction of lumbar region: Secondary | ICD-10-CM | POA: Diagnosis not present

## 2019-06-17 DIAGNOSIS — M545 Low back pain: Secondary | ICD-10-CM | POA: Diagnosis not present

## 2019-06-17 DIAGNOSIS — M62838 Other muscle spasm: Secondary | ICD-10-CM | POA: Diagnosis not present

## 2019-06-17 DIAGNOSIS — M9901 Segmental and somatic dysfunction of cervical region: Secondary | ICD-10-CM | POA: Diagnosis not present

## 2019-06-17 DIAGNOSIS — M9902 Segmental and somatic dysfunction of thoracic region: Secondary | ICD-10-CM | POA: Diagnosis not present

## 2019-06-18 ENCOUNTER — Ambulatory Visit: Payer: Medicare Other | Attending: Internal Medicine

## 2019-06-18 DIAGNOSIS — Z23 Encounter for immunization: Secondary | ICD-10-CM | POA: Insufficient documentation

## 2019-06-18 NOTE — Progress Notes (Signed)
   Covid-19 Vaccination Clinic  Name:  Austin Webb    MRN: [id number] DOB: [birth year]  06/18/2019  Austin Webb was observed post Covid-19 immunization for 15 minutes without incidence. He was provided with Vaccine Information Sheet and instruction to access the V-Safe system.   Austin Webb was instructed to call 911 with any severe reactions post vaccine: Marland Kitchen Difficulty breathing  . Swelling of your face and throat  . A fast heartbeat  . A bad rash all over your body  . Dizziness and weakness    Immunizations Administered    Name Date Dose VIS Date Route   Pfizer COVID-19 Vaccine 06/18/2019 11:42 AM 0.3 mL 05/15/2019 Intramuscular   Manufacturer: [place]   Lot: [id number]   [place]: [id number]

## 2019-06-22 DIAGNOSIS — M62838 Other muscle spasm: Secondary | ICD-10-CM | POA: Diagnosis not present

## 2019-06-22 DIAGNOSIS — M545 Low back pain: Secondary | ICD-10-CM | POA: Diagnosis not present

## 2019-06-22 DIAGNOSIS — M9902 Segmental and somatic dysfunction of thoracic region: Secondary | ICD-10-CM | POA: Diagnosis not present

## 2019-06-22 DIAGNOSIS — M9903 Segmental and somatic dysfunction of lumbar region: Secondary | ICD-10-CM | POA: Diagnosis not present

## 2019-06-22 DIAGNOSIS — M542 Cervicalgia: Secondary | ICD-10-CM | POA: Diagnosis not present

## 2019-06-22 DIAGNOSIS — M9901 Segmental and somatic dysfunction of cervical region: Secondary | ICD-10-CM | POA: Diagnosis not present

## 2019-06-29 DIAGNOSIS — M62838 Other muscle spasm: Secondary | ICD-10-CM | POA: Diagnosis not present

## 2019-06-29 DIAGNOSIS — M9902 Segmental and somatic dysfunction of thoracic region: Secondary | ICD-10-CM | POA: Diagnosis not present

## 2019-06-29 DIAGNOSIS — M9901 Segmental and somatic dysfunction of cervical region: Secondary | ICD-10-CM | POA: Diagnosis not present

## 2019-06-29 DIAGNOSIS — M542 Cervicalgia: Secondary | ICD-10-CM | POA: Diagnosis not present

## 2019-06-29 DIAGNOSIS — M545 Low back pain: Secondary | ICD-10-CM | POA: Diagnosis not present

## 2019-06-29 DIAGNOSIS — M9903 Segmental and somatic dysfunction of lumbar region: Secondary | ICD-10-CM | POA: Diagnosis not present

## 2019-07-08 DIAGNOSIS — M9902 Segmental and somatic dysfunction of thoracic region: Secondary | ICD-10-CM | POA: Diagnosis not present

## 2019-07-08 DIAGNOSIS — M62838 Other muscle spasm: Secondary | ICD-10-CM | POA: Diagnosis not present

## 2019-07-08 DIAGNOSIS — M9903 Segmental and somatic dysfunction of lumbar region: Secondary | ICD-10-CM | POA: Diagnosis not present

## 2019-07-08 DIAGNOSIS — M542 Cervicalgia: Secondary | ICD-10-CM | POA: Diagnosis not present

## 2019-07-08 DIAGNOSIS — M9901 Segmental and somatic dysfunction of cervical region: Secondary | ICD-10-CM | POA: Diagnosis not present

## 2019-07-08 DIAGNOSIS — M545 Low back pain: Secondary | ICD-10-CM | POA: Diagnosis not present

## 2019-07-09 ENCOUNTER — Ambulatory Visit: Payer: Medicare HMO | Attending: Internal Medicine

## 2019-07-09 DIAGNOSIS — Z23 Encounter for immunization: Secondary | ICD-10-CM | POA: Insufficient documentation

## 2019-07-09 NOTE — Progress Notes (Signed)
   Covid-19 Vaccination Clinic  Name:  Austin Webb    MRN: [id number] DOB: Oct 20, 1940  07/09/2019  [name] was observed post Covid-19 immunization for 15 minutes without incidence. He was provided with Vaccine Information Sheet and instruction to access the V-Safe system.   [name] was instructed to call 911 with any severe reactions post vaccine: Marland Kitchen Difficulty breathing  . Swelling of your face and throat  . A fast heartbeat  . A bad rash all over your body  . Dizziness and weakness    Immunizations Administered    Name Date Dose VIS Date Route   Pfizer COVID-19 Vaccine 07/09/2019 10:24 AM 0.3 mL 05/15/2019 Intramuscular   Manufacturer: [place]   Lot: [id number]   [place]: [id number]

## 2019-09-08 DIAGNOSIS — R739 Hyperglycemia, unspecified: Secondary | ICD-10-CM | POA: Diagnosis not present

## 2019-09-08 DIAGNOSIS — M109 Gout, unspecified: Secondary | ICD-10-CM | POA: Diagnosis not present

## 2019-09-08 DIAGNOSIS — Z0000 Encounter for general adult medical examination without abnormal findings: Secondary | ICD-10-CM | POA: Diagnosis not present

## 2019-09-08 DIAGNOSIS — Z125 Encounter for screening for malignant neoplasm of prostate: Secondary | ICD-10-CM | POA: Diagnosis not present

## 2019-09-08 DIAGNOSIS — N390 Urinary tract infection, site not specified: Secondary | ICD-10-CM | POA: Diagnosis not present

## 2019-09-08 DIAGNOSIS — Z126 Encounter for screening for malignant neoplasm of bladder: Secondary | ICD-10-CM | POA: Diagnosis not present

## 2019-09-08 DIAGNOSIS — I10 Essential (primary) hypertension: Secondary | ICD-10-CM | POA: Diagnosis not present

## 2019-09-17 DIAGNOSIS — N400 Enlarged prostate without lower urinary tract symptoms: Secondary | ICD-10-CM | POA: Diagnosis not present

## 2019-09-17 DIAGNOSIS — I10 Essential (primary) hypertension: Secondary | ICD-10-CM | POA: Diagnosis not present

## 2019-09-17 DIAGNOSIS — Z0000 Encounter for general adult medical examination without abnormal findings: Secondary | ICD-10-CM | POA: Diagnosis not present

## 2019-09-17 DIAGNOSIS — K219 Gastro-esophageal reflux disease without esophagitis: Secondary | ICD-10-CM | POA: Diagnosis not present

## 2019-09-17 DIAGNOSIS — M109 Gout, unspecified: Secondary | ICD-10-CM | POA: Diagnosis not present

## 2019-09-17 DIAGNOSIS — M129 Arthropathy, unspecified: Secondary | ICD-10-CM | POA: Diagnosis not present

## 2019-09-17 DIAGNOSIS — R69 Illness, unspecified: Secondary | ICD-10-CM | POA: Diagnosis not present

## 2019-09-17 DIAGNOSIS — R0989 Other specified symptoms and signs involving the circulatory and respiratory systems: Secondary | ICD-10-CM | POA: Diagnosis not present

## 2020-01-01 DIAGNOSIS — H35373 Puckering of macula, bilateral: Secondary | ICD-10-CM | POA: Diagnosis not present

## 2020-01-01 DIAGNOSIS — H26491 Other secondary cataract, right eye: Secondary | ICD-10-CM | POA: Diagnosis not present

## 2020-01-04 DIAGNOSIS — Z86010 Personal history of colonic polyps: Secondary | ICD-10-CM | POA: Diagnosis not present

## 2020-01-20 DIAGNOSIS — L570 Actinic keratosis: Secondary | ICD-10-CM | POA: Diagnosis not present

## 2020-01-20 DIAGNOSIS — Z85828 Personal history of other malignant neoplasm of skin: Secondary | ICD-10-CM | POA: Diagnosis not present

## 2020-01-20 DIAGNOSIS — L821 Other seborrheic keratosis: Secondary | ICD-10-CM | POA: Diagnosis not present

## 2020-01-20 DIAGNOSIS — D692 Other nonthrombocytopenic purpura: Secondary | ICD-10-CM | POA: Diagnosis not present

## 2020-01-20 DIAGNOSIS — L281 Prurigo nodularis: Secondary | ICD-10-CM | POA: Diagnosis not present

## 2020-02-24 DIAGNOSIS — R69 Illness, unspecified: Secondary | ICD-10-CM | POA: Diagnosis not present

## 2020-09-06 DIAGNOSIS — K649 Unspecified hemorrhoids: Secondary | ICD-10-CM | POA: Diagnosis not present

## 2020-09-06 DIAGNOSIS — R197 Diarrhea, unspecified: Secondary | ICD-10-CM | POA: Diagnosis not present

## 2020-09-06 DIAGNOSIS — R109 Unspecified abdominal pain: Secondary | ICD-10-CM | POA: Diagnosis not present

## 2020-09-06 DIAGNOSIS — K921 Melena: Secondary | ICD-10-CM | POA: Diagnosis not present

## 2020-09-13 DIAGNOSIS — I10 Essential (primary) hypertension: Secondary | ICD-10-CM | POA: Diagnosis not present

## 2020-09-13 DIAGNOSIS — R197 Diarrhea, unspecified: Secondary | ICD-10-CM | POA: Diagnosis not present

## 2020-09-13 DIAGNOSIS — R109 Unspecified abdominal pain: Secondary | ICD-10-CM | POA: Diagnosis not present

## 2020-09-13 DIAGNOSIS — Z0000 Encounter for general adult medical examination without abnormal findings: Secondary | ICD-10-CM | POA: Diagnosis not present

## 2020-09-13 DIAGNOSIS — E7800 Pure hypercholesterolemia, unspecified: Secondary | ICD-10-CM | POA: Diagnosis not present

## 2020-09-13 DIAGNOSIS — M1990 Unspecified osteoarthritis, unspecified site: Secondary | ICD-10-CM | POA: Diagnosis not present

## 2020-09-13 DIAGNOSIS — M109 Gout, unspecified: Secondary | ICD-10-CM | POA: Diagnosis not present

## 2020-09-13 DIAGNOSIS — Z87442 Personal history of urinary calculi: Secondary | ICD-10-CM | POA: Diagnosis not present

## 2020-09-20 DIAGNOSIS — Z0000 Encounter for general adult medical examination without abnormal findings: Secondary | ICD-10-CM | POA: Diagnosis not present

## 2020-09-20 DIAGNOSIS — I10 Essential (primary) hypertension: Secondary | ICD-10-CM | POA: Diagnosis not present

## 2020-09-20 DIAGNOSIS — Z79899 Other long term (current) drug therapy: Secondary | ICD-10-CM | POA: Diagnosis not present

## 2020-09-20 DIAGNOSIS — R739 Hyperglycemia, unspecified: Secondary | ICD-10-CM | POA: Diagnosis not present

## 2020-09-20 DIAGNOSIS — Z125 Encounter for screening for malignant neoplasm of prostate: Secondary | ICD-10-CM | POA: Diagnosis not present

## 2020-09-20 DIAGNOSIS — R69 Illness, unspecified: Secondary | ICD-10-CM | POA: Diagnosis not present

## 2020-09-20 DIAGNOSIS — K219 Gastro-esophageal reflux disease without esophagitis: Secondary | ICD-10-CM | POA: Diagnosis not present

## 2020-09-20 DIAGNOSIS — N400 Enlarged prostate without lower urinary tract symptoms: Secondary | ICD-10-CM | POA: Diagnosis not present

## 2020-09-20 DIAGNOSIS — M109 Gout, unspecified: Secondary | ICD-10-CM | POA: Diagnosis not present

## 2020-09-20 DIAGNOSIS — E7800 Pure hypercholesterolemia, unspecified: Secondary | ICD-10-CM | POA: Diagnosis not present

## 2020-10-24 DIAGNOSIS — K921 Melena: Secondary | ICD-10-CM | POA: Diagnosis not present

## 2020-10-24 DIAGNOSIS — K529 Noninfective gastroenteritis and colitis, unspecified: Secondary | ICD-10-CM | POA: Diagnosis not present

## 2020-10-24 DIAGNOSIS — R1030 Lower abdominal pain, unspecified: Secondary | ICD-10-CM | POA: Diagnosis not present

## 2020-10-25 DIAGNOSIS — K921 Melena: Secondary | ICD-10-CM | POA: Diagnosis not present

## 2020-11-17 DIAGNOSIS — M7061 Trochanteric bursitis, right hip: Secondary | ICD-10-CM | POA: Diagnosis not present

## 2020-12-09 DIAGNOSIS — R197 Diarrhea, unspecified: Secondary | ICD-10-CM | POA: Diagnosis not present

## 2021-01-11 DIAGNOSIS — H35373 Puckering of macula, bilateral: Secondary | ICD-10-CM | POA: Diagnosis not present

## 2021-01-24 DIAGNOSIS — D692 Other nonthrombocytopenic purpura: Secondary | ICD-10-CM | POA: Diagnosis not present

## 2021-01-24 DIAGNOSIS — D485 Neoplasm of uncertain behavior of skin: Secondary | ICD-10-CM | POA: Diagnosis not present

## 2021-01-24 DIAGNOSIS — L570 Actinic keratosis: Secondary | ICD-10-CM | POA: Diagnosis not present

## 2021-01-24 DIAGNOSIS — D044 Carcinoma in situ of skin of scalp and neck: Secondary | ICD-10-CM | POA: Diagnosis not present

## 2021-01-24 DIAGNOSIS — Z85828 Personal history of other malignant neoplasm of skin: Secondary | ICD-10-CM | POA: Diagnosis not present

## 2021-01-24 DIAGNOSIS — L821 Other seborrheic keratosis: Secondary | ICD-10-CM | POA: Diagnosis not present

## 2021-03-03 DIAGNOSIS — R300 Dysuria: Secondary | ICD-10-CM | POA: Diagnosis not present

## 2021-04-06 IMAGING — CT CT ABD-PELV W/ CM
1 of 3 series · 13 of 32 positions shown, 19 images · IV contrast (APPLIED)
Comparison: 12/04/2016 from [HOSPITAL]

CLINICAL DATA: Right-sided abdominal pain for 1 month. Previous
cholecystectomy and appendectomy.

Creatinine was obtained on site at [HOSPITAL] at [HOSPITAL].
Results: Creatinine 1.1 mg/dL.
EXAM:
CT ABDOMEN AND PELVIS WITH CONTRAST
TECHNIQUE: Multidetector CT imaging of the abdomen and pelvis was performed
using the standard protocol following bolus administration of
intravenous contrast.
CONTRAST:  100mL SPQIR3-YAA IOPAMIDOL (SPQIR3-YAA) INJECTION 61%

[Series 2: abd/pelvis w/cm · axial · 0.75mm/px · z∈[-497,-72]mm · 13 of 99 slices shown, 19 images]
[im 7/99  soft-tissue]
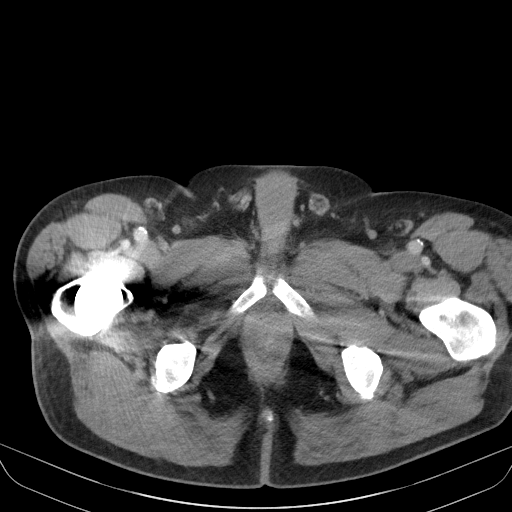
[im 7/99  bone]
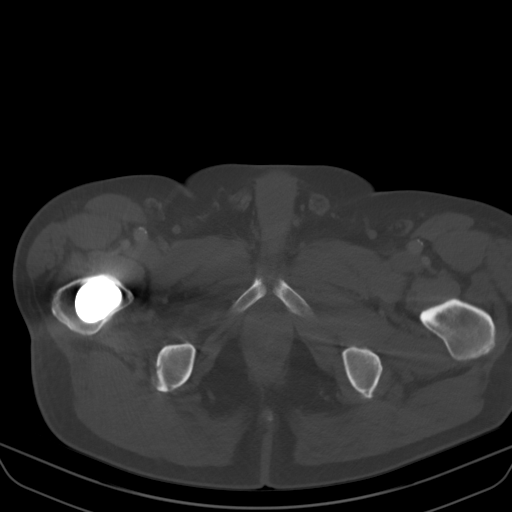
[im 14/99  soft-tissue]
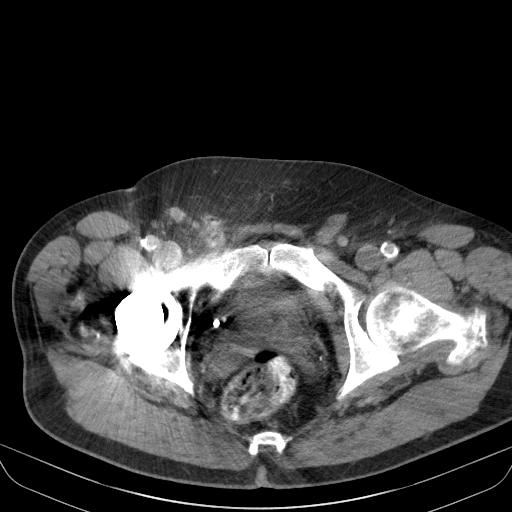
[im 20/99  soft-tissue]
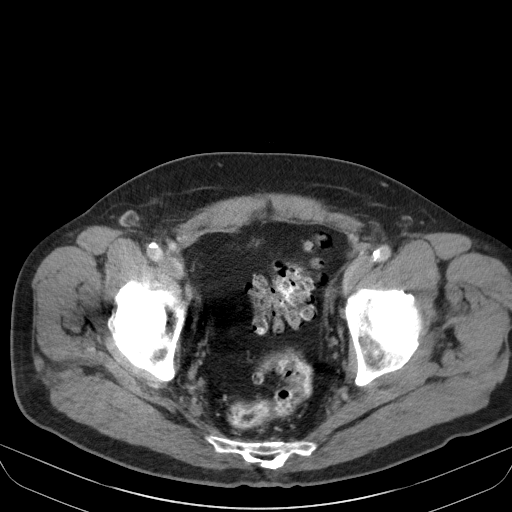
[im 27/99  soft-tissue]
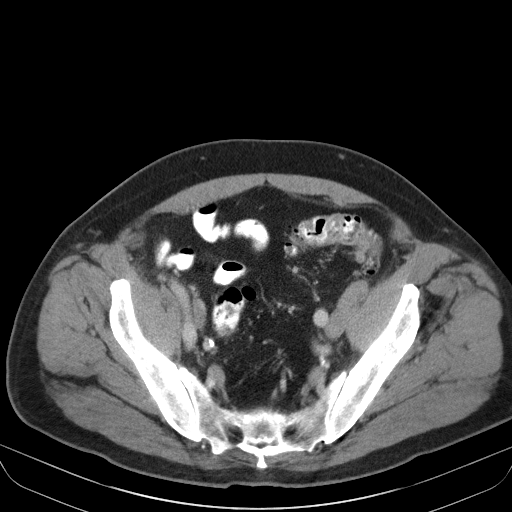
[im 33/99  soft-tissue]
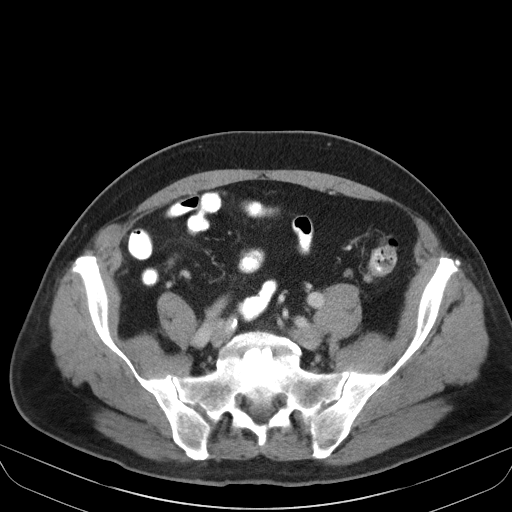
[im 40/99  soft-tissue]
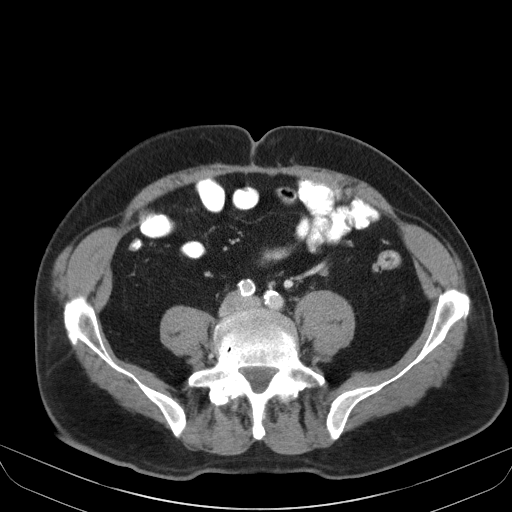
[im 53/99  soft-tissue]
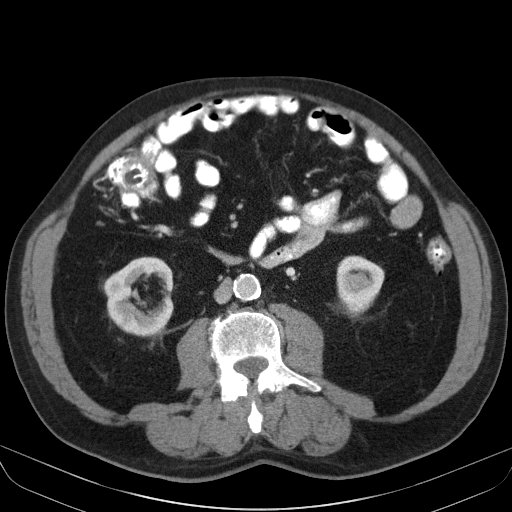
[im 59/99  soft-tissue]
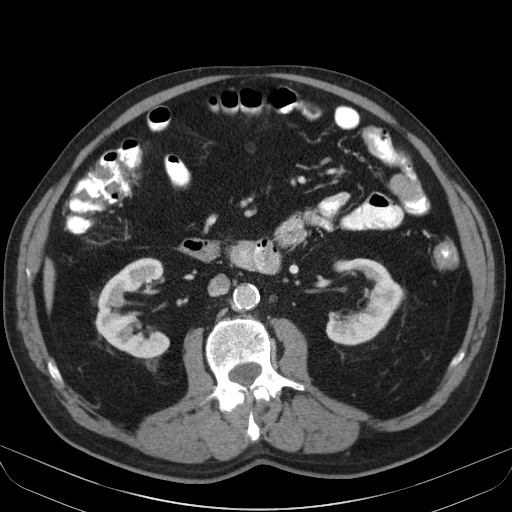
[im 66/99  soft-tissue]
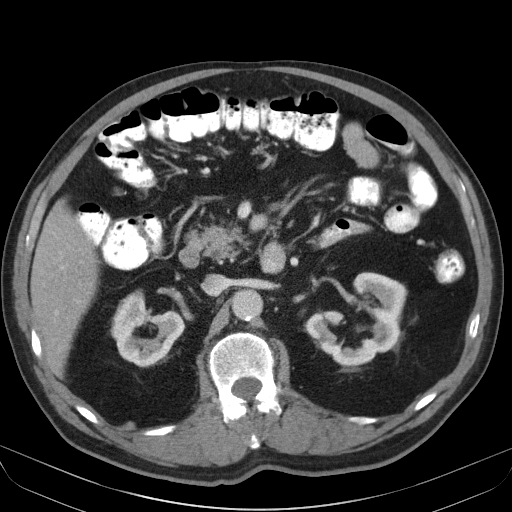
[im 66/99  bone]
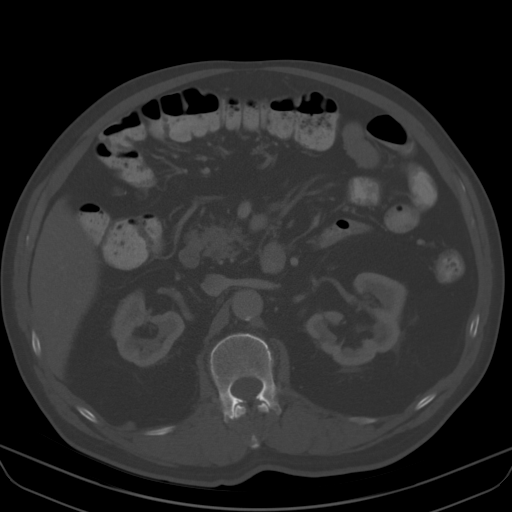
[im 72/99  soft-tissue]
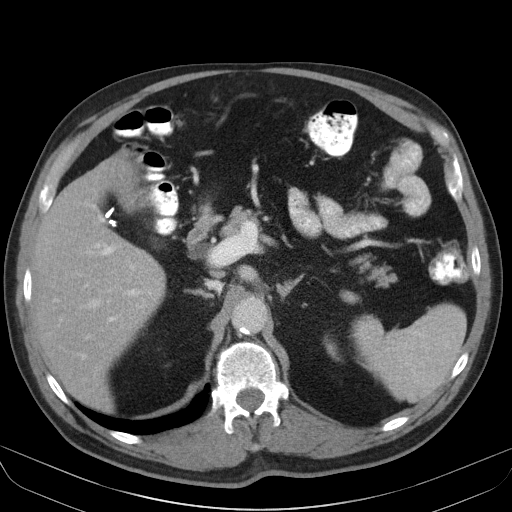
[im 72/99  lung]
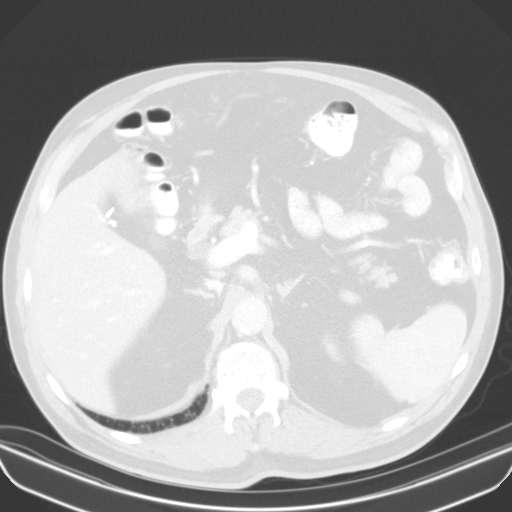
[im 79/99  soft-tissue]
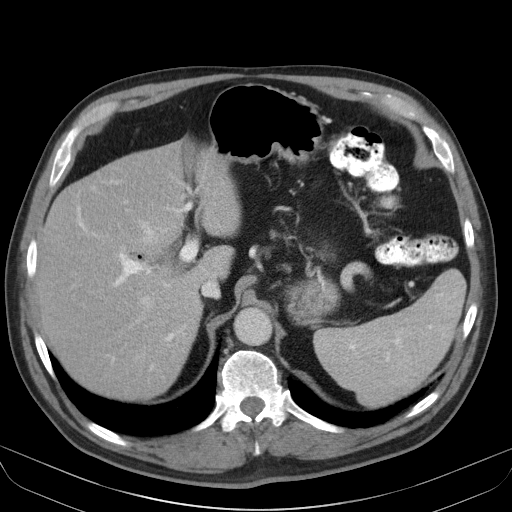
[im 79/99  lung]
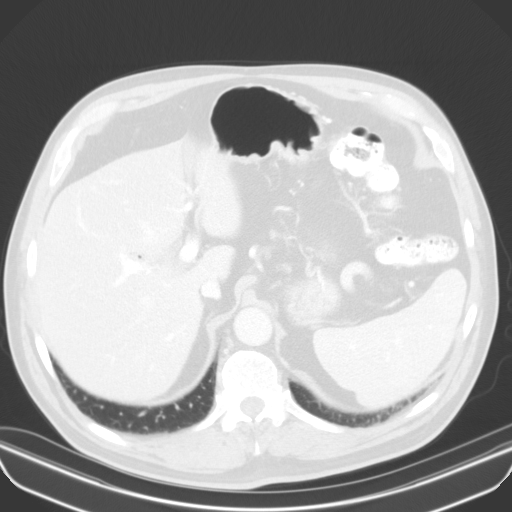
[im 85/99  soft-tissue]
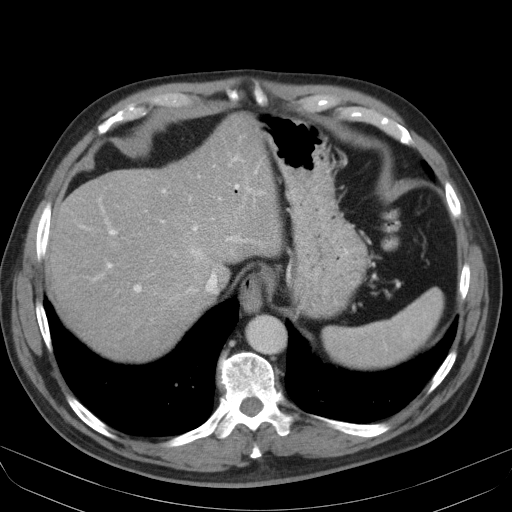
[im 85/99  lung]
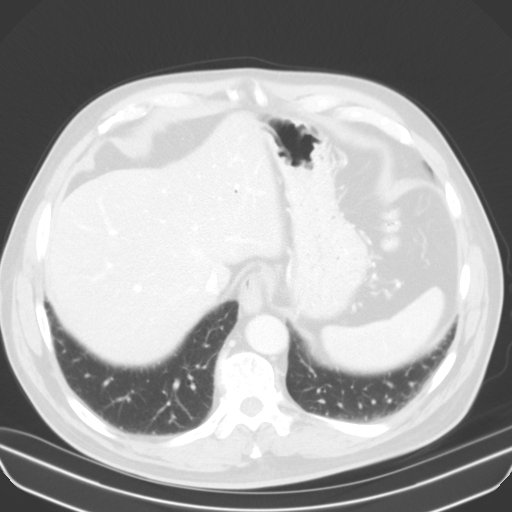
[im 92/99  soft-tissue]
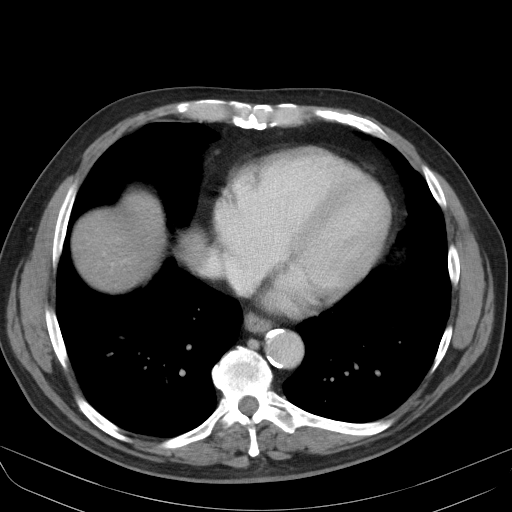
[im 92/99  lung]
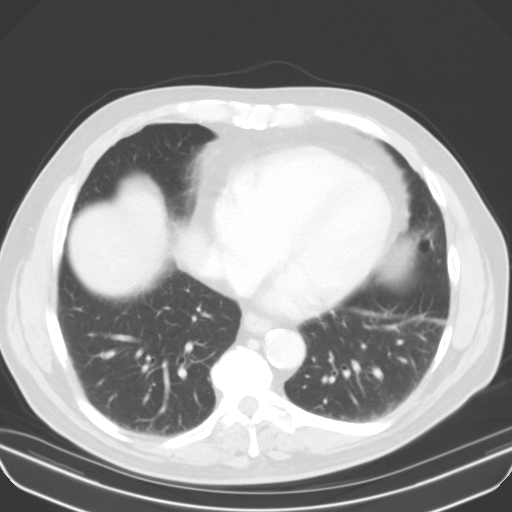

[13 of 32 positions shown; findings below may reference images not displayed]

FINDINGS: Lower Chest: No acute findings.

Hepatobiliary: No hepatic masses identified. Prior cholecystectomy.
No evidence of biliary obstruction. Pneumobilia is again seen,
consistent with prior sphincterotomy.

Pancreas:  No mass or inflammatory changes.

Spleen: Within normal limits in size and appearance.

Adrenals/Urinary Tract: No masses identified. Stable small right
renal cyst. No evidence of hydronephrosis.

Stomach/Bowel: No evidence of obstruction, inflammatory process or
abnormal fluid collections. Diverticulosis is seen mainly involving
the descending and sigmoid colon, however there is no evidence of
diverticulitis.

Vascular/Lymphatic: No pathologically enlarged lymph nodes. No
abdominal aortic aneurysm. Aortic atherosclerosis.

Reproductive:  No mass or other significant abnormality.

Other:  None.

Musculoskeletal: No suspicious bone lesions identified. Right hip
prosthesis noted.
IMPRESSION: Colonic diverticulosis. No radiographic evidence of diverticulitis
or other acute findings.

Aortic Atherosclerosis ([id number]).

## 2021-07-11 ENCOUNTER — Encounter (HOSPITAL_COMMUNITY): Payer: Self-pay | Admitting: *Deleted

## 2021-09-14 DIAGNOSIS — I10 Essential (primary) hypertension: Secondary | ICD-10-CM | POA: Diagnosis not present

## 2021-09-14 DIAGNOSIS — M109 Gout, unspecified: Secondary | ICD-10-CM | POA: Diagnosis not present

## 2021-09-14 DIAGNOSIS — R946 Abnormal results of thyroid function studies: Secondary | ICD-10-CM | POA: Diagnosis not present

## 2021-09-14 DIAGNOSIS — E7800 Pure hypercholesterolemia, unspecified: Secondary | ICD-10-CM | POA: Diagnosis not present

## 2021-09-14 DIAGNOSIS — Z79899 Other long term (current) drug therapy: Secondary | ICD-10-CM | POA: Diagnosis not present

## 2021-09-14 DIAGNOSIS — R739 Hyperglycemia, unspecified: Secondary | ICD-10-CM | POA: Diagnosis not present

## 2021-09-14 DIAGNOSIS — Z125 Encounter for screening for malignant neoplasm of prostate: Secondary | ICD-10-CM | POA: Diagnosis not present

## 2021-09-21 DIAGNOSIS — M5030 Other cervical disc degeneration, unspecified cervical region: Secondary | ICD-10-CM | POA: Diagnosis not present

## 2021-09-21 DIAGNOSIS — Z125 Encounter for screening for malignant neoplasm of prostate: Secondary | ICD-10-CM | POA: Diagnosis not present

## 2021-09-21 DIAGNOSIS — R69 Illness, unspecified: Secondary | ICD-10-CM | POA: Diagnosis not present

## 2021-09-21 DIAGNOSIS — Z0000 Encounter for general adult medical examination without abnormal findings: Secondary | ICD-10-CM | POA: Diagnosis not present

## 2021-09-21 DIAGNOSIS — N400 Enlarged prostate without lower urinary tract symptoms: Secondary | ICD-10-CM | POA: Diagnosis not present

## 2021-09-21 DIAGNOSIS — J300 Vasomotor rhinitis: Secondary | ICD-10-CM | POA: Diagnosis not present

## 2021-09-21 DIAGNOSIS — R7309 Other abnormal glucose: Secondary | ICD-10-CM | POA: Diagnosis not present

## 2021-09-21 DIAGNOSIS — R946 Abnormal results of thyroid function studies: Secondary | ICD-10-CM | POA: Diagnosis not present

## 2021-09-21 DIAGNOSIS — I10 Essential (primary) hypertension: Secondary | ICD-10-CM | POA: Diagnosis not present

## 2021-09-21 DIAGNOSIS — M109 Gout, unspecified: Secondary | ICD-10-CM | POA: Diagnosis not present

## 2021-09-21 DIAGNOSIS — K219 Gastro-esophageal reflux disease without esophagitis: Secondary | ICD-10-CM | POA: Diagnosis not present

## 2021-09-21 DIAGNOSIS — E7800 Pure hypercholesterolemia, unspecified: Secondary | ICD-10-CM | POA: Diagnosis not present

## 2021-10-09 ENCOUNTER — Other Ambulatory Visit: Payer: Self-pay | Admitting: Family Medicine

## 2021-10-09 ENCOUNTER — Ambulatory Visit
Admission: RE | Admit: 2021-10-09 | Discharge: 2021-10-09 | Disposition: A | Discharge status: Home / Self Care | Payer: Medicare HMO | Source: Ambulatory Visit | Attending: Family Medicine | Admitting: Family Medicine

## 2021-10-09 DIAGNOSIS — R0782 Intercostal pain: Secondary | ICD-10-CM

## 2021-10-11 ENCOUNTER — Other Ambulatory Visit: Payer: Self-pay | Admitting: Family Medicine

## 2021-10-11 DIAGNOSIS — R0781 Pleurodynia: Secondary | ICD-10-CM

## 2021-10-12 ENCOUNTER — Ambulatory Visit
Admission: RE | Admit: 2021-10-12 | Discharge: 2021-10-12 | Disposition: A | Discharge status: Home / Self Care | Payer: Medicare HMO | Source: Ambulatory Visit | Attending: Family Medicine | Admitting: Family Medicine

## 2021-10-12 DIAGNOSIS — M7062 Trochanteric bursitis, left hip: Secondary | ICD-10-CM | POA: Diagnosis not present

## 2021-10-12 DIAGNOSIS — R0781 Pleurodynia: Secondary | ICD-10-CM

## 2021-10-12 DIAGNOSIS — R918 Other nonspecific abnormal finding of lung field: Secondary | ICD-10-CM | POA: Diagnosis not present

## 2021-10-12 DIAGNOSIS — R079 Chest pain, unspecified: Secondary | ICD-10-CM | POA: Diagnosis not present

## 2021-10-12 DIAGNOSIS — I700 Atherosclerosis of aorta: Secondary | ICD-10-CM | POA: Diagnosis not present

## 2021-10-12 DIAGNOSIS — R911 Solitary pulmonary nodule: Secondary | ICD-10-CM | POA: Diagnosis not present

## 2022-01-12 DIAGNOSIS — H35373 Puckering of macula, bilateral: Secondary | ICD-10-CM | POA: Diagnosis not present

## 2022-01-24 ENCOUNTER — Encounter (HOSPITAL_COMMUNITY): Payer: Self-pay

## 2022-01-24 ENCOUNTER — Emergency Department (HOSPITAL_COMMUNITY): Payer: Medicare HMO

## 2022-01-24 ENCOUNTER — Encounter (HOSPITAL_COMMUNITY)
Admission: EM | Disposition: A | Discharge status: Home / Self Care | Payer: Self-pay | Source: Home / Self Care | Attending: Emergency Medicine

## 2022-01-24 ENCOUNTER — Emergency Department (HOSPITAL_BASED_OUTPATIENT_CLINIC_OR_DEPARTMENT_OTHER): Payer: Medicare HMO | Admitting: Certified Registered"

## 2022-01-24 ENCOUNTER — Ambulatory Visit (HOSPITAL_COMMUNITY)
Admission: EM | Admit: 2022-01-24 | Discharge: 2022-01-24 | Disposition: A | Discharge status: Home / Self Care | Payer: Medicare HMO | Attending: Emergency Medicine | Admitting: Emergency Medicine

## 2022-01-24 ENCOUNTER — Other Ambulatory Visit: Payer: Self-pay

## 2022-01-24 ENCOUNTER — Emergency Department (HOSPITAL_COMMUNITY): Payer: Medicare HMO | Admitting: Certified Registered"

## 2022-01-24 DIAGNOSIS — F419 Anxiety disorder, unspecified: Secondary | ICD-10-CM | POA: Insufficient documentation

## 2022-01-24 DIAGNOSIS — Y792 Prosthetic and other implants, materials and accessory orthopedic devices associated with adverse incidents: Secondary | ICD-10-CM | POA: Diagnosis not present

## 2022-01-24 DIAGNOSIS — W19XXXA Unspecified fall, initial encounter: Secondary | ICD-10-CM | POA: Insufficient documentation

## 2022-01-24 DIAGNOSIS — R69 Illness, unspecified: Secondary | ICD-10-CM | POA: Diagnosis not present

## 2022-01-24 DIAGNOSIS — G709 Myoneural disorder, unspecified: Secondary | ICD-10-CM | POA: Insufficient documentation

## 2022-01-24 DIAGNOSIS — T84020A Dislocation of internal right hip prosthesis, initial encounter: Secondary | ICD-10-CM | POA: Insufficient documentation

## 2022-01-24 DIAGNOSIS — Z79899 Other long term (current) drug therapy: Secondary | ICD-10-CM | POA: Diagnosis not present

## 2022-01-24 DIAGNOSIS — M1990 Unspecified osteoarthritis, unspecified site: Secondary | ICD-10-CM | POA: Diagnosis not present

## 2022-01-24 DIAGNOSIS — F32A Depression, unspecified: Secondary | ICD-10-CM | POA: Insufficient documentation

## 2022-01-24 DIAGNOSIS — S73004A Unspecified dislocation of right hip, initial encounter: Secondary | ICD-10-CM | POA: Diagnosis not present

## 2022-01-24 DIAGNOSIS — G4730 Sleep apnea, unspecified: Secondary | ICD-10-CM | POA: Insufficient documentation

## 2022-01-24 DIAGNOSIS — K219 Gastro-esophageal reflux disease without esophagitis: Secondary | ICD-10-CM | POA: Diagnosis not present

## 2022-01-24 DIAGNOSIS — Z87891 Personal history of nicotine dependence: Secondary | ICD-10-CM | POA: Insufficient documentation

## 2022-01-24 DIAGNOSIS — I10 Essential (primary) hypertension: Secondary | ICD-10-CM | POA: Insufficient documentation

## 2022-01-24 DIAGNOSIS — Z8546 Personal history of malignant neoplasm of prostate: Secondary | ICD-10-CM | POA: Insufficient documentation

## 2022-01-24 DIAGNOSIS — M25551 Pain in right hip: Secondary | ICD-10-CM | POA: Diagnosis not present

## 2022-01-24 DIAGNOSIS — Z743 Need for continuous supervision: Secondary | ICD-10-CM | POA: Diagnosis not present

## 2022-01-24 HISTORY — PX: HIP CLOSED REDUCTION: SHX983

## 2022-01-24 HISTORY — DX: Gout, unspecified: M10.9

## 2022-01-24 LAB — BASIC METABOLIC PANEL
Anion gap: 8 (ref 5–15)
BUN: 22 mg/dL (ref 8–23)
CO2: 25 mmol/L (ref 22–32)
Calcium: 9.2 mg/dL (ref 8.9–10.3)
Chloride: 108 mmol/L (ref 98–111)
Creatinine, Ser: 1.12 mg/dL (ref 0.61–1.24)
GFR, Estimated: >60 mL/min (ref >60)
Glucose, Bld: 127 mg/dL — ABNORMAL HIGH (ref 70–99)
Potassium: 4 mmol/L (ref 3.5–5.1)
Sodium: 141 mmol/L (ref 135–145)

## 2022-01-24 LAB — CBC WITH DIFFERENTIAL/PLATELET
Abs Immature Granulocytes: 0.13 10*3/uL — ABNORMAL HIGH (ref 0.00–0.07)
Basophils Absolute: 0 10*3/uL (ref 0.0–0.1)
Basophils Relative: 0 %
Eosinophils Absolute: 0 10*3/uL (ref 0.0–0.5)
Eosinophils Relative: 0 %
HCT: 45 % (ref 39.0–52.0)
Hemoglobin: 15.7 g/dL (ref 13.0–17.0)
Immature Granulocytes: 1 %
Lymphocytes Relative: 11 %
Lymphs Abs: 1.3 10*3/uL (ref 0.7–4.0)
MCH: 31.6 pg (ref 26.0–34.0)
MCHC: 34.9 g/dL (ref 30.0–36.0)
MCV: 90.5 fL (ref 80.0–100.0)
Monocytes Absolute: 0.5 10*3/uL (ref 0.1–1.0)
Monocytes Relative: 4 %
Neutro Abs: 10.7 10*3/uL — ABNORMAL HIGH (ref 1.7–7.7)
Neutrophils Relative %: 84 %
Platelets: 162 10*3/uL (ref 150–400)
RBC: 4.97 MIL/uL (ref 4.22–5.81)
RDW: 12.6 % (ref 11.5–15.5)
WBC: 12.7 10*3/uL — ABNORMAL HIGH (ref 4.0–10.5)
nRBC: 0 % (ref 0.0–0.2)

## 2022-01-24 LAB — SURGICAL PCR SCREEN
MRSA, PCR: NEGATIVE
Staphylococcus aureus: NEGATIVE

## 2022-01-24 SURGERY — CLOSED MANIPULATION, JOINT, HIP
Anesthesia: General | Site: Hip | Laterality: Right

## 2022-01-24 MED ORDER — LACTATED RINGERS IV BOLUS
1000.0000 mL | Freq: Once | INTRAVENOUS | Status: AC
  Administered 2022-01-24: 1000 mL via INTRAVENOUS

## 2022-01-24 MED ORDER — LIDOCAINE 2% (20 MG/ML) 5 ML SYRINGE
INTRAMUSCULAR | Status: AC
  Filled 2022-01-24: qty 5

## 2022-01-24 MED ORDER — FENTANYL CITRATE PF 50 MCG/ML IJ SOSY
100.0000 ug | PREFILLED_SYRINGE | Freq: Once | INTRAMUSCULAR | Status: AC
  Administered 2022-01-24: 100 ug via INTRAVENOUS
  Filled 2022-01-24: qty 2

## 2022-01-24 MED ORDER — MORPHINE SULFATE (PF) 4 MG/ML IV SOLN
4.0000 mg | Freq: Once | INTRAVENOUS | Status: AC
  Administered 2022-01-24: 4 mg via INTRAVENOUS
  Filled 2022-01-24: qty 1

## 2022-01-24 MED ORDER — SUCCINYLCHOLINE CHLORIDE 200 MG/10ML IV SOSY
PREFILLED_SYRINGE | INTRAVENOUS | Status: DC | PRN
  Administered 2022-01-24: 80 mg via INTRAVENOUS

## 2022-01-24 MED ORDER — PROPOFOL 10 MG/ML IV BOLUS
INTRAVENOUS | Status: AC
  Filled 2022-01-24: qty 20

## 2022-01-24 MED ORDER — LIDOCAINE 2% (20 MG/ML) 5 ML SYRINGE
INTRAMUSCULAR | Status: DC | PRN
  Administered 2022-01-24: 80 mg via INTRAVENOUS

## 2022-01-24 MED ORDER — ONDANSETRON HCL 4 MG/2ML IJ SOLN
4.0000 mg | Freq: Once | INTRAMUSCULAR | Status: DC | PRN
Start: 2022-01-24 — End: 2022-01-24

## 2022-01-24 MED ORDER — ACETAMINOPHEN 10 MG/ML IV SOLN
INTRAVENOUS | Status: AC
  Filled 2022-01-24: qty 100

## 2022-01-24 MED ORDER — CHLORHEXIDINE GLUCONATE 0.12 % MT SOLN
15.0000 mL | Freq: Once | OROMUCOSAL | Status: AC
  Administered 2022-01-24: 15 mL via OROMUCOSAL

## 2022-01-24 MED ORDER — OXYCODONE HCL 5 MG PO TABS: 5.0000 mg | ORAL_TABLET | Freq: Four times a day (QID) | ORAL | 0 refills | Status: AC | PRN

## 2022-01-24 MED ORDER — ONDANSETRON HCL 4 MG/2ML IJ SOLN
INTRAMUSCULAR | Status: AC
  Filled 2022-01-24: qty 2

## 2022-01-24 MED ORDER — PROPOFOL 10 MG/ML IV BOLUS
0.5000 mg/kg | Freq: Once | INTRAVENOUS | Status: AC
  Administered 2022-01-24: 45 mg via INTRAVENOUS
  Filled 2022-01-24: qty 20

## 2022-01-24 MED ORDER — OXYCODONE HCL 5 MG PO TABS
ORAL_TABLET | ORAL | Status: AC
  Administered 2022-01-24: 5 mg
  Filled 2022-01-24: qty 1

## 2022-01-24 MED ORDER — SUCCINYLCHOLINE CHLORIDE 200 MG/10ML IV SOSY
PREFILLED_SYRINGE | INTRAVENOUS | Status: AC
  Filled 2022-01-24: qty 10

## 2022-01-24 MED ORDER — AMISULPRIDE (ANTIEMETIC) 5 MG/2ML IV SOLN: 10.0000 mg | Freq: Once | INTRAVENOUS | Status: DC | PRN

## 2022-01-24 MED ORDER — LACTATED RINGERS IV SOLN: INTRAVENOUS | Status: DC

## 2022-01-24 MED ORDER — PROPOFOL 10 MG/ML IV BOLUS
INTRAVENOUS | Status: DC | PRN
  Administered 2022-01-24: 160 mg via INTRAVENOUS

## 2022-01-24 MED ORDER — ONDANSETRON HCL 4 MG/2ML IJ SOLN
4.0000 mg | Freq: Once | INTRAMUSCULAR | Status: AC
  Administered 2022-01-24: 4 mg via INTRAVENOUS
  Filled 2022-01-24: qty 2

## 2022-01-24 MED ORDER — ACETAMINOPHEN 10 MG/ML IV SOLN
1000.0000 mg | Freq: Once | INTRAVENOUS | Status: DC | PRN
  Administered 2022-01-24: 1000 mg via INTRAVENOUS

## 2022-01-24 MED ORDER — FENTANYL CITRATE PF 50 MCG/ML IJ SOSY: 25.0000 ug | PREFILLED_SYRINGE | INTRAMUSCULAR | Status: DC | PRN

## 2022-01-24 MED ORDER — ORAL CARE MOUTH RINSE: 15.0000 mL | Freq: Once | OROMUCOSAL | Status: AC

## 2022-01-24 MED ORDER — ONDANSETRON HCL 4 MG/2ML IJ SOLN
INTRAMUSCULAR | Status: DC | PRN
  Administered 2022-01-24: 4 mg via INTRAVENOUS

## 2022-01-24 SURGICAL SUPPLY — 2 items
IMMOBILIZER KNEE 20 (SOFTGOODS) ×1 IMPLANT
IMMOBILIZER KNEE 20 THIGH 36 (SOFTGOODS) IMPLANT

## 2022-01-24 NOTE — Anesthesia Preprocedure Evaluation (Signed)
Anesthesia Evaluation  Patient identified by MRN, date of birth, ID band Patient awake    Reviewed: Allergy & Precautions, NPO status , Patient's Chart, lab work & pertinent test results  Airway Mallampati: II  TM Distance: >3 FB Neck ROM: Full    Dental  (+) Edentulous Upper, Edentulous Lower   Pulmonary sleep apnea , former smoker,    Pulmonary exam normal        Cardiovascular hypertension, Pt. on medications Normal cardiovascular exam     Neuro/Psych PSYCHIATRIC DISORDERS Anxiety Depression  Neuromuscular disease    GI/Hepatic Neg liver ROS, GERD  Medicated and Controlled,  Endo/Other  negative endocrine ROS  Renal/GU negative Renal ROS     Musculoskeletal  (+) Arthritis ,   Abdominal   Peds  Hematology negative hematology ROS (+)   Anesthesia Other Findings RIGHT HIP DISLOCATION  Reproductive/Obstetrics                             Anesthesia Physical Anesthesia Plan  ASA: 2 and emergent  Anesthesia Plan: General   Post-op Pain Management:    Induction: Intravenous  PONV Risk Score and Plan: 2 and Ondansetron, Aprepitant and Treatment may vary due to age or medical condition  Airway Management Planned: Oral ETT  Additional Equipment:   Intra-op Plan:   Post-operative Plan: Extubation in OR  Informed Consent: I have reviewed the patients History and Physical, chart, labs and discussed the procedure including the risks, benefits and alternatives for the proposed anesthesia with the patient or authorized representative who has indicated his/her understanding and acceptance.       Plan Discussed with: CRNA  Anesthesia Plan Comments:         Anesthesia Quick Evaluation

## 2022-01-24 NOTE — Anesthesia Postprocedure Evaluation (Signed)
Anesthesia Post Note  Patient: Austin Webb  Procedure(s) Performed: CLOSED MANIPULATION HIP (Right: Hip)     Patient location during evaluation: PACU Anesthesia Type: General Level of consciousness: awake Pain management: pain level controlled Vital Signs Assessment: post-procedure vital signs reviewed and stable Respiratory status: spontaneous breathing, nonlabored ventilation, respiratory function stable and patient connected to nasal cannula oxygen Cardiovascular status: blood pressure returned to baseline and stable Postop Assessment: no apparent nausea or vomiting Anesthetic complications: no   No notable events documented.  Last Vitals:  Vitals:   01/24/22 1805 01/24/22 1827  BP: 123/88   Pulse: 70 68  Resp: 16   Temp: 36.7 C 36.7 C  SpO2: 93% 96%    Last Pain:  Vitals:   01/24/22 1827  TempSrc:   PainSc: 1                  Kesa Birky P Galvin Aversa

## 2022-01-24 NOTE — Transfer of Care (Signed)
Immediate Anesthesia Transfer of Care Note  Patient: Austin Webb  Procedure(s) Performed: CLOSED MANIPULATION HIP (Right: Hip)  Patient Location: PACU  Anesthesia Type:General  Level of Consciousness: awake  Airway & Oxygen Therapy: Patient Spontanous Breathing and Patient connected to face mask oxygen  Post-op Assessment: Report given to RN and Post -op Vital signs reviewed and stable  Post vital signs: Reviewed and stable  Last Vitals:  Vitals Value Taken Time  BP    Temp    Pulse    Resp    SpO2      Last Pain:  Vitals:   01/24/22 1622  TempSrc: Oral  PainSc: 8          Complications: No notable events documented.

## 2022-01-24 NOTE — ED Triage Notes (Signed)
[facility] from home, felt right hip "pop", then fell on to right hip. Hip replacement done 2011.Received 200 mcg Fentanyl PTA

## 2022-01-24 NOTE — Sedation Documentation (Signed)
20 mg Propofol given by Dr. Oswald Hillock

## 2022-01-24 NOTE — Anesthesia Procedure Notes (Signed)
Procedure Name: General with mask airway Date/Time: 01/24/2022 4:53 PM  Performed by: Emmett Bracknell, Jinger Neighbors, CRNA

## 2022-01-24 NOTE — Progress Notes (Signed)
Pacu Discharge Note  Patient instuctions were given to family. Wound care, diet, pain, follow up care and how and whom to contact with concerns were discussed. Family aware someone needs to remain with patient overnight and concerns after receiving anesthesia and what to avoid and safety. Answered all questions and concerns.   Discharge paperwork has clear contact informations for surgeon and 24 hour RN line for concerns.   Discussed what concerns to look for including infection and signs/symptoms to look for.   Discussed touch down weight bearing, using walker when ambulating, who to contact and hip precatations. Pt received Oxycodone '5mg'$  at 1750pm.   IV was removed prior to discharge. Patient was brought to car with belongings.   Pt exits my care.

## 2022-01-24 NOTE — Sedation Documentation (Signed)
25 mg Propofol given by Dr. Oswald Hillock

## 2022-01-24 NOTE — ED Provider Notes (Signed)
[facility] DEPT Provider Note   CSN: [id number] Arrival date & time: 01/24/22  1215     History  Chief Complaint  Patient presents with   Hip Pain    Austin Webb is a 81 y.o. male.  Patient presents to the hospital complaining of right-sided hip pain.  Patient transported via EMS after feeling a pop this morning followed by a subsequent fall onto the right side.  Patient states he had his right hip replaced by Dr. Frederik Pear in approximately 2011.  Patient denies hitting his head, denies blood thinner usage, denies eating today.  Patient also denies any sort of syncopal episode surrounding the fall.  Past medical history significant for hypertension, GERD, depression, anxiety, arthritis, sleep apnea, prostate cancer  HPI     Home Medications Prior to Admission medications   Medication Sig Start Date End Date Taking? Authorizing Provider  aspirin EC 81 MG tablet Take 81 mg by mouth at bedtime.     [provider]  Multiple Vitamin (MULTIVITAMIN WITH MINERALS) TABS tablet Take 1 tablet by mouth daily.    [provider]  pantoprazole (PROTONIX) 40 MG tablet Take 40 mg by mouth every morning.    [provider]  sertraline (ZOLOFT) 100 MG tablet Take 100 mg by mouth at bedtime.     [provider]  tamsulosin (FLOMAX) 0.4 MG CAPS capsule Take 0.4 mg by mouth daily.    [provider]  traMADol (ULTRAM) 50 MG tablet Take 1 tablet (50 mg total) by mouth every 6 (six) hours as needed for moderate pain. 01/31/17   Michael Boston, MD      Allergies    Arthrotec [diclofenac-misoprostol]    Review of Systems   Review of Systems  Musculoskeletal:  Positive for arthralgias.    Physical Exam Updated Vital Signs BP 132/82   Pulse (!) 55   Temp 97.8 F (36.6 C) (Oral)   Resp 13   Ht 6' (1.829 m)   Wt 90 kg   SpO2 97%   BMI 26.91 kg/m  Physical Exam Vitals and nursing note reviewed.  Constitutional:       General: He is in acute distress.     Appearance: He is normal weight.  HENT:     Head: Normocephalic and atraumatic.     Mouth/Throat:     Mouth: Mucous membranes are moist.  Eyes:     Conjunctiva/sclera: Conjunctivae normal.  Cardiovascular:     Rate and Rhythm: Normal rate and regular rhythm.     Pulses: Normal pulses.     Heart sounds: Normal heart sounds.  Pulmonary:     Effort: Pulmonary effort is normal.     Breath sounds: Normal breath sounds.  Abdominal:     Palpations: Abdomen is soft.     Tenderness: There is no abdominal tenderness.  Musculoskeletal:        General: Tenderness, deformity and signs of injury present.     Cervical back: Normal range of motion and neck supple.     Comments: Right lower extremity shortened and rotated internally, DP pulse palpable  Skin:    General: Skin is warm and dry.     Capillary Refill: Capillary refill takes less than 2 seconds.  Neurological:     Mental Status: He is alert and oriented to person, place, and time.     ED Results / Procedures / Treatments   Labs (all labs ordered are listed, but only abnormal results  are displayed) Labs Reviewed  CBC WITH DIFFERENTIAL/PLATELET - Abnormal; Notable for the following components:      Result Value   WBC 12.7 (*)    Neutro Abs 10.7 (*)    Abs Immature Granulocytes 0.13 (*)    All other components within normal limits  BASIC METABOLIC PANEL    EKG None  Radiology DG Hip Unilat With Pelvis 2-3 Views Right  Result Date: 01/24/2022 CLINICAL DATA:  Fall, pop in hip region. History of hip arthroplasty. EXAM: DG HIP (WITH OR WITHOUT PELVIS) 2-3V RIGHT COMPARISON:  Imaging from May 22, 2010. FINDINGS: Superior and posterior dislocation of the RIGHT hip arthroplasty. Femoral component dislocated relative to acetabulum. No sign of periprosthetic fracture. No acute fracture of the bony pelvis. Osteopenia. Degenerative changes incidentally noted in the lumbar spine. 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 Electronically Signed   By: Zetta Bills M.D.   On: 01/24/2022 13:12    Procedures Procedures    Medications Ordered in ED Medications  morphine (PF) 4 MG/ML injection 4 mg (4 mg Intravenous Given 01/24/22 1315)  ondansetron (ZOFRAN) injection 4 mg (4 mg Intravenous Given 01/24/22 1315)  propofol (DIPRIVAN) 10 mg/mL bolus/IV push 45 mg (45 mg Intravenous Given 01/24/22 1352)  lactated ringers bolus 1,000 mL (1,000 mLs Intravenous New Bag/Given 01/24/22 1409)  fentaNYL (SUBLIMAZE) injection 100 mcg (100 mcg Intravenous Given 01/24/22 1449)    ED Course/ Medical Decision Making/ A&P                           Medical Decision Making Amount and/or Complexity of Data Reviewed Labs: ordered. Radiology: ordered.  Risk Prescription drug management.   This patient presents to the ED for concern of right hip pain, this involves an extensive number of treatment options, and is a complaint that carries with it a high risk of complications and morbidity.  The differential diagnosis includes dislocation, periprosthetic fracture, soft tissue injury, and others   Co morbidities that complicate the patient evaluation  History of right THA   Additional history obtained:  Additional history obtained from EMS   Lab Tests:  I Ordered, and personally interpreted labs.  The pertinent results include: WBC 12.7, BMP pending   Imaging Studies ordered:  I ordered imaging studies including plain films of the right hip I independently visualized and interpreted imaging which showed superior and posterior dislocation of right total hip arthroplasty I agree with the radiologist interpretation  Consultations Obtained:  I requested consultation with the orthopedist on call for [place], Dr.Adair working on plan   Problem List / ED Course / Critical interventions / Medication management   I ordered medication including  morphine and fentanyl for pain, Zofran for nausea Reevaluation of the patient after these medicines showed that the patient stayed the same I have reviewed the patients home medicines and have made adjustments as needed  Test / Admission - Considered:  Hip reduction attempted at bedside patient was under conscious sedation. Reduction was unsuccessful. Plan for consultation with orthopedics for further recommendations  Dr.Adair returned call and is working on plan. Patient care being transferred to Carlisle Cater, PA-C        Final Clinical Impression(s) / ED Diagnoses Final diagnoses:  Dislocation of right hip, initial encounter [facility])    Rx / DC Orders ED Discharge Orders     None         Ronny Bacon 01/24/22  [place], MD 01/24/22 1529

## 2022-01-24 NOTE — Sedation Documentation (Signed)
$'20mg'r$  Propofol given by Dr. Oswald Hillock

## 2022-01-24 NOTE — Sedation Documentation (Signed)
$'25mg'h$  Propofol given by Dr. Oswald Hillock

## 2022-01-24 NOTE — Op Note (Signed)
  Austin Webb male 81 y.o. 01/24/2022  PreOperative Diagnosis: Right prosthetic hip dislocation  PostOperative Diagnosis: Same  PROCEDURE: Closed reduction of right prosthetic hip dislocation with manipulation under anesthesia  SURGEON: Melony Overly, MD  ASSISTANT: Victorino December, MD  ANESTHESIA: General  FINDINGS: Dislocated right prosthetic hip  IMPLANTS: None  INDICATIONS:81 y.o. male was bathing and felt his right hip dislocate with sudden onset of pain deformity.  He was brought to the emergency department where x-rays revealed hip dislocation.  They were unsuccessful at reducing it in the emergency department and therefore orthopedics was consulted.  Patient has a hip dislocation and is indicated for closed reduction.   Patient understood the risks, benefits and alternatives to surgery which include but are not limited to wound healing complications, infection, nonunion, malunion, need for further surgery as well as damage to surrounding structures. They also understood the potential for continued pain in that there were no guarantees of acceptable outcome After weighing these risks the patient opted to proceed with surgery.  PROCEDURE: Patient was identified in the preoperative holding area.  Right hip was signed.  Consent was signed by myself and the patient.  He was taken to the operative suite and left on the hospital bed.  Propofol and paralytic was pushed.    Once anesthetic and set up timeout performed.  Then the hip was reduced with manipulation.  It was difficult at first but then subsequently reduced with a clunk using flexion, internal rotation and adduction.  Then flatplate x-ray confirmed adequate and concentric hip reduction.  He was placed in a knee immobilizer.  He was then awakened from anesthesia and taken recovery in stable condition.  No complications.   POST OPERATIVE INSTRUCTIONS: Maintain knee immobilizer and avoid hip flexion and  adduction Patient will follow-up with Dr. Mayer Camel in 1 week for check.   TOURNIQUET TIME: None        COMPLICATIONS:  * No complications entered in OR log *         Disposition: PACU - hemodynamically stable.         Condition: stable

## 2022-01-24 NOTE — Consult Note (Signed)
Reason for Consult: Right prosthetic hip dislocation Referring Physician: [facility]  Austin Webb is an 81 y.o. male.  HPI: Patient presents emergency department with pain and deformity around the right hip.  He was lifting his leg while bathing and had immediate onset of severe pain around the right hip.  He has never had a dislocation event before but has had some popping and subluxation type events previously.  His hip was placed in 2011 by Dr. Mayer Camel.  Attempt was made in the emergency department under conscious sedation to reduce the hip and were unsuccessful.  Orthopedics was consulted.  He was seen in preoperative complaining of right hip pain.  He has deformity.  He has difficulty moving the hip.  No other joint or extremity pain.  Past Medical History:  Diagnosis Date   Anxiety    Arthritis    Osteoarthritis-left hip, s/p arthroplasty knees, right hip   Cancer ([place])    basal and squamous cell cancer lesion   Cancer ([place]) 06/04/2000   prostate   Depression    GERD (gastroesophageal reflux disease)    Gout    History of kidney stones    x1 passed on own   Hypertension    Neuromuscular disorder ([facility])    restless leg syndrome   Sleep apnea    no cpap used    Past Surgical History:  Procedure Laterality Date   4 titanium posts for denture placement     APPENDECTOMY     UNKNOWN   BUNIONECTOMY Bilateral 08/02/2012   CATARACT EXTRACTION Left 2003   CATARACT EXTRACTION W/PHACO Right 08/23/2015   Procedure: CATARACT EXTRACTION PHACO AND INTRAOCULAR LENS PLACEMENT ([place]);  Surgeon: Birder Robson, MD;  Location: [facility] ORS;  Service: Ophthalmology;  Laterality: Right;  [place] 1:00 AP% 35.2 CDE 10.11 fluid pack lot # [id number]   CHOLECYSTECTOMY     "stones"- Laparoscopic   ERCP N/A 02/18/2013   Procedure: ENDOSCOPIC RETROGRADE CHOLANGIOPANCREATOGRAPHY (ERCP);  Surgeon: Arta Silence, MD;  Location: [facility];  Service: Endoscopy;  Laterality: N/A;   EUS  N/A 02/18/2013   Procedure: ESOPHAGEAL ENDOSCOPIC ULTRASOUND (EUS) RADIAL;  Surgeon: Arta Silence, MD;  Location: [facility];  Service: Endoscopy;  Laterality: N/A;   EYE SURGERY     HAMMER TOE SURGERY Left    and bunionectomy   HAND ARTHROTOMY Right    thumb surgery   HIP ARTHROPLASTY Right    INGUINAL HERNIA REPAIR N/A 01/31/2017   Procedure: LAPAROSCOPIC EXPLORATION AND REPAIR OF BILATERAL INGUINAL HERNIAS;  Surgeon: Michael Boston, MD;  Location: [facility];  Service: General;  Laterality: N/A;  With MESH   JOINT REPLACEMENT     KNEE ARTHROPLASTY Bilateral 2000   LAPAROSCOPIC APPENDECTOMY N/A 11/03/2013   Procedure: APPENDECTOMY LAPAROSCOPIC/ PRIMARY INCISIONAL HERNIA REPAIR;  Surgeon: Rolm Bookbinder, MD;  Location: [facility];  Service: General;  Laterality: N/A;   TRANSURETHRAL RESECTION OF PROSTATE      Family History  Problem Relation Age of Onset   Diabetes Mellitus II Neg Hx    CAD Neg Hx     Social History:  reports that he quit smoking about 39 years ago. His smoking use included cigarettes. He has a 12.00 pack-year smoking history. He has never used smokeless tobacco. He reports current alcohol use of about 1.0 standard drink of alcohol per week. He reports that he does not use drugs.  Allergies:  Allergies  Allergen Reactions   Arthrotec [Diclofenac-Misoprostol] Diarrhea    Medications: I have reviewed  the patient's current medications.  Results for orders placed or performed during the hospital encounter of 01/24/22 (from the past 48 hour(s))  Basic metabolic panel     Status: Abnormal   Collection Time: 01/24/22  2:51 PM  Result Value Ref Range   Sodium 141 135 - 145 mmol/L   Potassium 4.0 3.5 - 5.1 mmol/L   Chloride 108 98 - 111 mmol/L   CO2 25 22 - 32 mmol/L   Glucose, Bld 127 (H) 70 - 99 mg/dL    Comment: Glucose reference range applies only to samples taken after fasting for at least 8 hours.   BUN 22 8 - 23 mg/dL   Creatinine, Ser 1.12 0.61 - 1.24 mg/dL    Calcium 9.2 8.9 - 10.3 mg/dL   GFR, Estimated >60 >60 mL/min    Comment: (NOTE) Calculated using the CKD-EPI Creatinine Equation (2021)    Anion gap 8 5 - 15    Comment: Performed at [facility], [place] [address], [place], [place] [address]  CBC with Differential     Status: Abnormal   Collection Time: 01/24/22  2:51 PM  Result Value Ref Range   WBC 12.7 (H) 4.0 - 10.5 K/uL   RBC 4.97 4.22 - 5.81 MIL/uL   Hemoglobin 15.7 13.0 - 17.0 g/dL   HCT 45.0 39.0 - 52.0 %   MCV 90.5 80.0 - 100.0 fL   MCH 31.6 26.0 - 34.0 pg   MCHC 34.9 30.0 - 36.0 g/dL   RDW 12.6 11.5 - 15.5 %   Platelets 162 150 - 400 K/uL   nRBC 0.0 0.0 - 0.2 %   Neutrophils Relative % 84 %   Neutro Abs 10.7 (H) 1.7 - 7.7 K/uL   Lymphocytes Relative 11 %   Lymphs Abs 1.3 0.7 - 4.0 K/uL   Monocytes Relative 4 %   Monocytes Absolute 0.5 0.1 - 1.0 K/uL   Eosinophils Relative 0 %   Eosinophils Absolute 0.0 0.0 - 0.5 K/uL   Basophils Relative 0 %   Basophils Absolute 0.0 0.0 - 0.1 K/uL   Immature Granulocytes 1 %   Abs Immature Granulocytes 0.13 (H) 0.00 - 0.07 K/uL    Comment: Performed at [facility], [place] [address] [place], [place] [address]    DG Hip Unilat With Pelvis 2-3 Views Right  Result Date: 01/24/2022 CLINICAL DATA:  Fall, pop in hip region. History of hip arthroplasty. EXAM: DG HIP (WITH OR WITHOUT PELVIS) 2-3V RIGHT COMPARISON:  Imaging from May 22, 2010. FINDINGS: Superior and posterior dislocation of the RIGHT hip arthroplasty. Femoral component dislocated relative to acetabulum. No sign of periprosthetic fracture. No acute fracture of the bony pelvis. Osteopenia. Degenerative changes incidentally noted in the lumbar spine. 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 Electronically Signed   By: Zetta Bills M.D.   On: 01/24/2022 13:12    Review of Systems  Constitutional: Negative.   HENT: Negative.    Eyes: Negative.    Respiratory: Negative.    Cardiovascular: Negative.   Gastrointestinal: Negative.   Musculoskeletal:        Right hip pain  Neurological: Negative.   Psychiatric/Behavioral: Negative.     Blood pressure (!) 142/93, pulse 72, temperature 98 F (36.7 C), temperature source Oral, resp. rate 16, height 6' (1.829 m), weight 93 kg, SpO2 94 %. Physical Exam HENT:     Head: Normocephalic.     Mouth/Throat:     Mouth: Mucous membranes are  dry.  Eyes:     Extraocular Movements: Extraocular movements intact.  Cardiovascular:     Rate and Rhythm: Normal rate.  Pulmonary:     Effort: Pulmonary effort is normal.  Abdominal:     General: Abdomen is flat.  Musculoskeletal:     Comments: Right hip is shortened and rotated internally.  Pain with attempted range of motion of the hip.  Knee is flexed.  No skin lacerations.  No other joint or extremity injury.  Sensation grossly intact distally.  Neurological:     General: No focal deficit present.     Mental Status: He is alert.  Psychiatric:        Mood and Affect: Mood normal.     Assessment/Plan: Right hip prosthetic dislocation.  We will plan for closed reduction with manipulation and sedation in the operative suite.  We will place him in a knee immobilizer postreduction.  He will then be discharged and follow-up with Dr. Mayer Camel in the clinic.  Erle Crocker 01/24/2022, 4:30 PM

## 2022-01-24 NOTE — ED Provider Notes (Signed)
[facility] DEPT Provider Note   CSN: [id number] Arrival date & time: 01/24/22  1215     History  Chief Complaint  Patient presents with   Hip Pain    Austin Webb is a 81 y.o. male.  HPI     Home Medications Prior to Admission medications   Medication Sig Start Date End Date Taking? Authorizing Provider  aspirin EC 81 MG tablet Take 81 mg by mouth at bedtime.     [provider]  Multiple Vitamin (MULTIVITAMIN WITH MINERALS) TABS tablet Take 1 tablet by mouth daily.    [provider]  pantoprazole (PROTONIX) 40 MG tablet Take 40 mg by mouth every morning.    [provider]  sertraline (ZOLOFT) 100 MG tablet Take 100 mg by mouth at bedtime.     [provider]  tamsulosin (FLOMAX) 0.4 MG CAPS capsule Take 0.4 mg by mouth daily.    [provider]  traMADol (ULTRAM) 50 MG tablet Take 1 tablet (50 mg total) by mouth every 6 (six) hours as needed for moderate pain. 01/31/17   Michael Boston, MD      Allergies    Arthrotec [diclofenac-misoprostol]    Review of Systems   Review of Systems  Physical Exam Updated Vital Signs BP (!) 150/83   Pulse 72   Temp 97.8 F (36.6 C) (Oral)   Resp 13   Ht 6' (1.829 m)   Wt 90 kg   SpO2 97%   BMI 26.91 kg/m  Physical Exam  ED Results / Procedures / Treatments   Labs (all labs ordered are listed, but only abnormal results are displayed) Labs Reviewed  CBC WITH DIFFERENTIAL/PLATELET - Abnormal; Notable for the following components:      Result Value   WBC 12.7 (*)    Neutro Abs 10.7 (*)    Abs Immature Granulocytes 0.13 (*)    All other components within normal limits  BASIC METABOLIC PANEL    EKG None  Radiology DG Hip Unilat With Pelvis 2-3 Views Right  Result Date: 01/24/2022 CLINICAL DATA:  Fall, pop in hip region. History of hip arthroplasty. EXAM: DG HIP (WITH OR WITHOUT PELVIS) 2-3V RIGHT COMPARISON:  Imaging from May 22, 2010.  FINDINGS: Superior and posterior dislocation of the RIGHT hip arthroplasty. Femoral component dislocated relative to acetabulum. No sign of periprosthetic fracture. No acute fracture of the bony pelvis. Osteopenia. Degenerative changes incidentally noted in the lumbar spine. 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 Electronically Signed   By: Zetta Bills M.D.   On: 01/24/2022 13:12    Procedures .Sedation  Date/Time: 01/24/2022 3:30 PM  Performed by: Tretha Sciara, MD Authorized by: Tretha Sciara, MD   Consent:    Consent obtained:  Written   Consent given by:  Patient   Risks discussed:  Allergic reaction, dysrhythmia, nausea, vomiting, respiratory compromise necessitating ventilatory assistance and intubation, prolonged sedation necessitating reversal, prolonged hypoxia resulting in organ damage and inadequate sedation   Alternatives discussed:  Analgesia without sedation Universal protocol:    Immediately prior to procedure, a time out was called: yes   Pre-sedation assessment:    Time since last food or drink:  12 hours   ASA classification: class 1 - normal, healthy patient     Mouth opening:  3 or more finger widths   Mallampati score:  I - soft palate, uvula, fauces, pillars visible   Pre-sedation assessments completed and reviewed: airway patency, cardiovascular function,  hydration status, mental status, nausea/vomiting, pain level, respiratory function and temperature     Pre-sedation assessment completed:  01/24/2022 1:00 PM Immediate pre-procedure details:    Reassessment: Patient reassessed immediately prior to procedure     Reviewed: vital signs     Verified: emergency equipment available   Procedure details (see MAR for exact dosages):    Total Provider sedation time (minutes):  35 Post-procedure details:    Post-sedation assessment completed:  01/24/2022 3:31 PM   Attendance: Constant attendance by certified staff until patient  recovered     Recovery: Patient returned to pre-procedure baseline     Patient is stable for discharge or admission: yes     Procedure completion:  Tolerated well, no immediate complications     Medications Ordered in ED Medications  morphine (PF) 4 MG/ML injection 4 mg (4 mg Intravenous Given 01/24/22 1315)  ondansetron (ZOFRAN) injection 4 mg (4 mg Intravenous Given 01/24/22 1315)  propofol (DIPRIVAN) 10 mg/mL bolus/IV push 45 mg (45 mg Intravenous Given 01/24/22 1352)  lactated ringers bolus 1,000 mL (0 mLs Intravenous Stopped 01/24/22 1522)  fentaNYL (SUBLIMAZE) injection 100 mcg (100 mcg Intravenous Given 01/24/22 1449)    ED Course/ Medical Decision Making/ A&P                                   Final Clinical Impression(s) / ED Diagnoses Final diagnoses:  Dislocation of right hip, initial encounter [facility])    Rx / [place] Orders ED Discharge Orders     None         Tretha Sciara, MD 01/24/22 1531

## 2022-01-25 ENCOUNTER — Encounter (HOSPITAL_COMMUNITY): Payer: Self-pay | Admitting: Orthopaedic Surgery

## 2022-02-01 DIAGNOSIS — M25551 Pain in right hip: Secondary | ICD-10-CM | POA: Diagnosis not present

## 2022-02-01 DIAGNOSIS — M1612 Unilateral primary osteoarthritis, left hip: Secondary | ICD-10-CM | POA: Diagnosis not present

## 2022-04-10 DIAGNOSIS — D0462 Carcinoma in situ of skin of left upper limb, including shoulder: Secondary | ICD-10-CM | POA: Diagnosis not present

## 2022-04-10 DIAGNOSIS — D044 Carcinoma in situ of skin of scalp and neck: Secondary | ICD-10-CM | POA: Diagnosis not present

## 2022-04-10 DIAGNOSIS — Z85828 Personal history of other malignant neoplasm of skin: Secondary | ICD-10-CM | POA: Diagnosis not present

## 2022-04-10 DIAGNOSIS — D485 Neoplasm of uncertain behavior of skin: Secondary | ICD-10-CM | POA: Diagnosis not present

## 2022-04-10 DIAGNOSIS — L570 Actinic keratosis: Secondary | ICD-10-CM | POA: Diagnosis not present

## 2022-04-10 DIAGNOSIS — D045 Carcinoma in situ of skin of trunk: Secondary | ICD-10-CM | POA: Diagnosis not present

## 2022-04-10 DIAGNOSIS — L821 Other seborrheic keratosis: Secondary | ICD-10-CM | POA: Diagnosis not present

## 2022-05-16 DIAGNOSIS — D224 Melanocytic nevi of scalp and neck: Secondary | ICD-10-CM | POA: Diagnosis not present

## 2022-05-16 DIAGNOSIS — D044 Carcinoma in situ of skin of scalp and neck: Secondary | ICD-10-CM | POA: Diagnosis not present

## 2022-05-16 DIAGNOSIS — C4442 Squamous cell carcinoma of skin of scalp and neck: Secondary | ICD-10-CM | POA: Diagnosis not present

## 2022-05-23 DIAGNOSIS — M7062 Trochanteric bursitis, left hip: Secondary | ICD-10-CM | POA: Diagnosis not present

## 2022-07-09 DIAGNOSIS — Z87438 Personal history of other diseases of male genital organs: Secondary | ICD-10-CM | POA: Diagnosis not present

## 2022-07-09 DIAGNOSIS — R3915 Urgency of urination: Secondary | ICD-10-CM | POA: Diagnosis not present

## 2022-07-09 DIAGNOSIS — R351 Nocturia: Secondary | ICD-10-CM | POA: Diagnosis not present

## 2022-09-17 DIAGNOSIS — Z79899 Other long term (current) drug therapy: Secondary | ICD-10-CM | POA: Diagnosis not present

## 2022-09-17 DIAGNOSIS — Z13220 Encounter for screening for lipoid disorders: Secondary | ICD-10-CM | POA: Diagnosis not present

## 2022-09-17 DIAGNOSIS — R3915 Urgency of urination: Secondary | ICD-10-CM | POA: Diagnosis not present

## 2022-09-17 DIAGNOSIS — N320 Bladder-neck obstruction: Secondary | ICD-10-CM | POA: Diagnosis not present

## 2022-09-17 DIAGNOSIS — Z125 Encounter for screening for malignant neoplasm of prostate: Secondary | ICD-10-CM | POA: Diagnosis not present

## 2022-09-17 DIAGNOSIS — R946 Abnormal results of thyroid function studies: Secondary | ICD-10-CM | POA: Diagnosis not present

## 2022-09-17 DIAGNOSIS — R7309 Other abnormal glucose: Secondary | ICD-10-CM | POA: Diagnosis not present

## 2022-09-25 DIAGNOSIS — I10 Essential (primary) hypertension: Secondary | ICD-10-CM | POA: Diagnosis not present

## 2022-09-25 DIAGNOSIS — Z0000 Encounter for general adult medical examination without abnormal findings: Secondary | ICD-10-CM | POA: Diagnosis not present

## 2022-09-25 DIAGNOSIS — Z87891 Personal history of nicotine dependence: Secondary | ICD-10-CM | POA: Diagnosis not present

## 2022-09-25 DIAGNOSIS — M5030 Other cervical disc degeneration, unspecified cervical region: Secondary | ICD-10-CM | POA: Diagnosis not present

## 2022-09-25 DIAGNOSIS — N400 Enlarged prostate without lower urinary tract symptoms: Secondary | ICD-10-CM | POA: Diagnosis not present

## 2022-09-25 DIAGNOSIS — M79606 Pain in leg, unspecified: Secondary | ICD-10-CM | POA: Diagnosis not present

## 2022-09-25 DIAGNOSIS — K219 Gastro-esophageal reflux disease without esophagitis: Secondary | ICD-10-CM | POA: Diagnosis not present

## 2022-09-25 DIAGNOSIS — M5136 Other intervertebral disc degeneration, lumbar region: Secondary | ICD-10-CM | POA: Diagnosis not present

## 2022-09-25 DIAGNOSIS — F329 Major depressive disorder, single episode, unspecified: Secondary | ICD-10-CM | POA: Diagnosis not present

## 2022-09-25 DIAGNOSIS — E7800 Pure hypercholesterolemia, unspecified: Secondary | ICD-10-CM | POA: Diagnosis not present

## 2022-09-25 DIAGNOSIS — D582 Other hemoglobinopathies: Secondary | ICD-10-CM | POA: Diagnosis not present

## 2022-09-25 DIAGNOSIS — M109 Gout, unspecified: Secondary | ICD-10-CM | POA: Diagnosis not present

## 2022-11-20 ENCOUNTER — Encounter: Payer: Self-pay | Admitting: Family Medicine

## 2022-11-20 DIAGNOSIS — Z9889 Other specified postprocedural states: Secondary | ICD-10-CM | POA: Diagnosis not present

## 2022-11-20 DIAGNOSIS — Z9079 Acquired absence of other genital organ(s): Secondary | ICD-10-CM | POA: Diagnosis not present

## 2022-11-20 DIAGNOSIS — Z87438 Personal history of other diseases of male genital organs: Secondary | ICD-10-CM | POA: Diagnosis not present

## 2022-11-20 DIAGNOSIS — R3915 Urgency of urination: Secondary | ICD-10-CM | POA: Diagnosis not present

## 2022-11-22 ENCOUNTER — Encounter: Payer: Self-pay | Admitting: Family Medicine

## 2022-12-26 DIAGNOSIS — M109 Gout, unspecified: Secondary | ICD-10-CM | POA: Diagnosis not present

## 2022-12-26 DIAGNOSIS — D582 Other hemoglobinopathies: Secondary | ICD-10-CM | POA: Diagnosis not present

## 2022-12-28 ENCOUNTER — Other Ambulatory Visit: Payer: Self-pay | Admitting: Family Medicine

## 2022-12-28 DIAGNOSIS — M79659 Pain in unspecified thigh: Secondary | ICD-10-CM

## 2022-12-28 DIAGNOSIS — M79606 Pain in leg, unspecified: Secondary | ICD-10-CM

## 2022-12-28 DIAGNOSIS — I70203 Unspecified atherosclerosis of native arteries of extremities, bilateral legs: Secondary | ICD-10-CM

## 2022-12-31 ENCOUNTER — Encounter: Payer: Self-pay | Admitting: Family Medicine

## 2023-01-01 ENCOUNTER — Other Ambulatory Visit: Payer: Medicare HMO

## 2023-01-09 DIAGNOSIS — M25522 Pain in left elbow: Secondary | ICD-10-CM | POA: Diagnosis not present

## 2023-01-17 DIAGNOSIS — Z9889 Other specified postprocedural states: Secondary | ICD-10-CM | POA: Diagnosis not present

## 2023-01-17 DIAGNOSIS — H35373 Puckering of macula, bilateral: Secondary | ICD-10-CM | POA: Diagnosis not present

## 2023-01-17 DIAGNOSIS — H35039 Hypertensive retinopathy, unspecified eye: Secondary | ICD-10-CM | POA: Diagnosis not present

## 2023-01-17 DIAGNOSIS — M3501 Sicca syndrome with keratoconjunctivitis: Secondary | ICD-10-CM | POA: Diagnosis not present

## 2023-01-21 DIAGNOSIS — Z87438 Personal history of other diseases of male genital organs: Secondary | ICD-10-CM | POA: Diagnosis not present

## 2023-01-21 DIAGNOSIS — R3915 Urgency of urination: Secondary | ICD-10-CM | POA: Diagnosis not present

## 2023-01-23 DIAGNOSIS — M25522 Pain in left elbow: Secondary | ICD-10-CM | POA: Diagnosis not present

## 2023-02-26 DIAGNOSIS — Z96641 Presence of right artificial hip joint: Secondary | ICD-10-CM | POA: Diagnosis not present

## 2023-04-15 DIAGNOSIS — J300 Vasomotor rhinitis: Secondary | ICD-10-CM | POA: Diagnosis not present

## 2023-04-16 DIAGNOSIS — D692 Other nonthrombocytopenic purpura: Secondary | ICD-10-CM | POA: Diagnosis not present

## 2023-04-16 DIAGNOSIS — D225 Melanocytic nevi of trunk: Secondary | ICD-10-CM | POA: Diagnosis not present

## 2023-04-16 DIAGNOSIS — Z85828 Personal history of other malignant neoplasm of skin: Secondary | ICD-10-CM | POA: Diagnosis not present

## 2023-04-16 DIAGNOSIS — L905 Scar conditions and fibrosis of skin: Secondary | ICD-10-CM | POA: Diagnosis not present

## 2023-04-16 DIAGNOSIS — L821 Other seborrheic keratosis: Secondary | ICD-10-CM | POA: Diagnosis not present

## 2023-04-16 DIAGNOSIS — L570 Actinic keratosis: Secondary | ICD-10-CM | POA: Diagnosis not present

## 2023-05-28 DIAGNOSIS — M79675 Pain in left toe(s): Secondary | ICD-10-CM | POA: Diagnosis not present

## 2023-05-28 DIAGNOSIS — M79672 Pain in left foot: Secondary | ICD-10-CM | POA: Diagnosis not present

## 2023-08-12 ENCOUNTER — Emergency Department (HOSPITAL_COMMUNITY)

## 2023-08-12 ENCOUNTER — Emergency Department (HOSPITAL_BASED_OUTPATIENT_CLINIC_OR_DEPARTMENT_OTHER): Admitting: Anesthesiology

## 2023-08-12 ENCOUNTER — Ambulatory Visit (HOSPITAL_COMMUNITY)
Admission: EM | Admit: 2023-08-12 | Discharge: 2023-08-12 | Disposition: A | Discharge status: Home / Self Care | Attending: Emergency Medicine | Admitting: Emergency Medicine

## 2023-08-12 ENCOUNTER — Other Ambulatory Visit: Payer: Self-pay

## 2023-08-12 ENCOUNTER — Encounter (HOSPITAL_COMMUNITY): Payer: Self-pay | Admitting: *Deleted

## 2023-08-12 ENCOUNTER — Encounter (HOSPITAL_COMMUNITY)
Admission: EM | Disposition: A | Discharge status: Home / Self Care | Payer: Self-pay | Source: Home / Self Care | Attending: Emergency Medicine

## 2023-08-12 ENCOUNTER — Emergency Department (HOSPITAL_COMMUNITY): Admitting: Anesthesiology

## 2023-08-12 DIAGNOSIS — M1990 Unspecified osteoarthritis, unspecified site: Secondary | ICD-10-CM | POA: Diagnosis not present

## 2023-08-12 DIAGNOSIS — Y792 Prosthetic and other implants, materials and accessory orthopedic devices associated with adverse incidents: Secondary | ICD-10-CM | POA: Diagnosis not present

## 2023-08-12 DIAGNOSIS — G4730 Sleep apnea, unspecified: Secondary | ICD-10-CM | POA: Diagnosis not present

## 2023-08-12 DIAGNOSIS — X501XXA Overexertion from prolonged static or awkward postures, initial encounter: Secondary | ICD-10-CM | POA: Diagnosis not present

## 2023-08-12 DIAGNOSIS — F418 Other specified anxiety disorders: Secondary | ICD-10-CM

## 2023-08-12 DIAGNOSIS — T84020D Dislocation of internal right hip prosthesis, subsequent encounter: Secondary | ICD-10-CM | POA: Diagnosis not present

## 2023-08-12 DIAGNOSIS — S73004A Unspecified dislocation of right hip, initial encounter: Secondary | ICD-10-CM | POA: Diagnosis not present

## 2023-08-12 DIAGNOSIS — Z87891 Personal history of nicotine dependence: Secondary | ICD-10-CM | POA: Insufficient documentation

## 2023-08-12 DIAGNOSIS — Z79899 Other long term (current) drug therapy: Secondary | ICD-10-CM | POA: Diagnosis not present

## 2023-08-12 DIAGNOSIS — I10 Essential (primary) hypertension: Secondary | ICD-10-CM | POA: Insufficient documentation

## 2023-08-12 DIAGNOSIS — Z743 Need for continuous supervision: Secondary | ICD-10-CM | POA: Diagnosis not present

## 2023-08-12 DIAGNOSIS — M25551 Pain in right hip: Secondary | ICD-10-CM | POA: Diagnosis not present

## 2023-08-12 DIAGNOSIS — G4733 Obstructive sleep apnea (adult) (pediatric): Secondary | ICD-10-CM

## 2023-08-12 DIAGNOSIS — R69 Illness, unspecified: Secondary | ICD-10-CM | POA: Diagnosis not present

## 2023-08-12 DIAGNOSIS — K219 Gastro-esophageal reflux disease without esophagitis: Secondary | ICD-10-CM | POA: Diagnosis not present

## 2023-08-12 DIAGNOSIS — T84020A Dislocation of internal right hip prosthesis, initial encounter: Secondary | ICD-10-CM | POA: Diagnosis not present

## 2023-08-12 DIAGNOSIS — Z96641 Presence of right artificial hip joint: Secondary | ICD-10-CM | POA: Diagnosis not present

## 2023-08-12 SURGERY — CLOSED REDUCTION, HIP
Anesthesia: General | Site: Hip | Laterality: Right

## 2023-08-12 MED ORDER — ONDANSETRON HCL 4 MG/2ML IJ SOLN
INTRAMUSCULAR | Status: AC
  Filled 2023-08-12: qty 2

## 2023-08-12 MED ORDER — KETAMINE HCL 50 MG/5ML IJ SOSY
1.0000 mg/kg | PREFILLED_SYRINGE | Freq: Once | INTRAMUSCULAR | Status: DC
  Filled 2023-08-12: qty 10

## 2023-08-12 MED ORDER — ROCURONIUM BROMIDE 10 MG/ML (PF) SYRINGE
PREFILLED_SYRINGE | INTRAVENOUS | Status: AC
  Filled 2023-08-12: qty 10

## 2023-08-12 MED ORDER — PROPOFOL 10 MG/ML IV BOLUS
INTRAVENOUS | Status: AC
  Filled 2023-08-12: qty 20

## 2023-08-12 MED ORDER — ONDANSETRON HCL 4 MG/2ML IJ SOLN
INTRAMUSCULAR | Status: DC | PRN
  Administered 2023-08-12: 4 mg via INTRAVENOUS

## 2023-08-12 MED ORDER — PROPOFOL 10 MG/ML IV BOLUS
1.0000 mg/kg | Freq: Once | INTRAVENOUS | Status: DC
  Filled 2023-08-12: qty 20

## 2023-08-12 MED ORDER — FENTANYL CITRATE (PF) 100 MCG/2ML IJ SOLN
INTRAMUSCULAR | Status: DC | PRN
  Administered 2023-08-12: 25 ug via INTRAVENOUS
  Administered 2023-08-12: 50 ug via INTRAVENOUS
  Administered 2023-08-12: 25 ug via INTRAVENOUS

## 2023-08-12 MED ORDER — PROPOFOL 10 MG/ML IV BOLUS
INTRAVENOUS | Status: DC | PRN
  Administered 2023-08-12: 120 mg via INTRAVENOUS

## 2023-08-12 MED ORDER — FENTANYL CITRATE (PF) 100 MCG/2ML IJ SOLN
INTRAMUSCULAR | Status: AC
  Filled 2023-08-12: qty 2

## 2023-08-12 MED ORDER — LIDOCAINE HCL (CARDIAC) PF 100 MG/5ML IV SOSY
PREFILLED_SYRINGE | INTRAVENOUS | Status: DC | PRN
Start: 2023-08-12 — End: 2023-08-12
  Administered 2023-08-12: 60 mg via INTRAVENOUS

## 2023-08-12 MED ORDER — SUCCINYLCHOLINE CHLORIDE 200 MG/10ML IV SOSY
PREFILLED_SYRINGE | INTRAVENOUS | Status: AC
  Filled 2023-08-12: qty 10

## 2023-08-12 MED ORDER — TRAMADOL HCL 50 MG PO TABS: 50.0000 mg | ORAL_TABLET | Freq: Four times a day (QID) | ORAL | 0 refills | Status: DC | PRN

## 2023-08-12 MED ORDER — LIDOCAINE HCL (PF) 2 % IJ SOLN
INTRAMUSCULAR | Status: AC
  Filled 2023-08-12: qty 5

## 2023-08-12 MED ORDER — SUCCINYLCHOLINE CHLORIDE 200 MG/10ML IV SOSY
PREFILLED_SYRINGE | INTRAVENOUS | Status: DC | PRN
  Administered 2023-08-12: 100 mg via INTRAVENOUS

## 2023-08-12 MED ORDER — FENTANYL CITRATE PF 50 MCG/ML IJ SOSY: 25.0000 ug | PREFILLED_SYRINGE | INTRAMUSCULAR | Status: DC | PRN

## 2023-08-12 MED ORDER — LACTATED RINGERS IV SOLN: INTRAVENOUS | Status: DC

## 2023-08-12 MED ORDER — CHLORHEXIDINE GLUCONATE 0.12 % MT SOLN
15.0000 mL | Freq: Once | OROMUCOSAL | Status: AC
  Administered 2023-08-12: 15 mL via OROMUCOSAL

## 2023-08-12 SURGICAL SUPPLY — 2 items
IMMOBILIZER KNEE 20 (SOFTGOODS) ×1 IMPLANT
IMMOBILIZER KNEE 20 THIGH 36 (SOFTGOODS) IMPLANT

## 2023-08-12 NOTE — Anesthesia Preprocedure Evaluation (Addendum)
 Anesthesia Evaluation  Patient identified by MRN, date of birth, ID band Patient awake    Reviewed: Allergy & Precautions, H&P , NPO status , Patient's Chart, lab work & pertinent test results  Airway Mallampati: II  TM Distance: >3 FB Neck ROM: Full    Dental no notable dental hx. (+) Edentulous Upper, Edentulous Lower, Dental Advisory Given   Pulmonary sleep apnea , former smoker   Pulmonary exam normal breath sounds clear to auscultation       Cardiovascular hypertension, Pt. on medications  Rhythm:Regular Rate:Normal     Neuro/Psych   Anxiety Depression    negative neurological ROS     GI/Hepatic Neg liver ROS,GERD  Medicated,,  Endo/Other  negative endocrine ROS    Renal/GU negative Renal ROS  negative genitourinary   Musculoskeletal  (+) Arthritis , Osteoarthritis,    Abdominal   Peds  Hematology  (+) Blood dyscrasia, anemia   Anesthesia Other Findings   Reproductive/Obstetrics negative OB ROS                             Anesthesia Physical Anesthesia Plan  ASA: 3 and emergent  Anesthesia Plan: General   Post-op Pain Management: Minimal or no pain anticipated   Induction: Intravenous, Rapid sequence and Cricoid pressure planned  PONV Risk Score and Plan: 2 and Ondansetron and Dexamethasone  Airway Management Planned: Oral ETT  Additional Equipment:   Intra-op Plan:   Post-operative Plan: Extubation in OR  Informed Consent: I have reviewed the patients History and Physical, chart, labs and discussed the procedure including the risks, benefits and alternatives for the proposed anesthesia with the patient or authorized representative who has indicated his/her understanding and acceptance.     Dental advisory given  Plan Discussed with: CRNA  Anesthesia Plan Comments:        Anesthesia Quick Evaluation

## 2023-08-12 NOTE — ED Triage Notes (Signed)
 Pt BIBA from home. C/o R hip pain- w/possible shortening and rotation. Hx R hip replacement.  Pt was putting shoes on when they heard a pop and had to sit down because they couldn't bear weight.  Given 200 mcg fentanyl by EMS

## 2023-08-12 NOTE — Anesthesia Postprocedure Evaluation (Signed)
 Anesthesia Post Note  Patient: Austin Webb  Procedure(s) Performed: CLOSED REDUCTION, HIP (Right: Hip)     Patient location during evaluation: PACU Anesthesia Type: General Level of consciousness: awake and alert Pain management: pain level controlled Vital Signs Assessment: post-procedure vital signs reviewed and stable Respiratory status: spontaneous breathing, nonlabored ventilation and respiratory function stable Cardiovascular status: blood pressure returned to baseline and stable Postop Assessment: no apparent nausea or vomiting Anesthetic complications: no  No notable events documented.  Last Vitals:  Vitals:   08/12/23 1915 08/12/23 1930  BP: 130/77   Pulse: 72   Resp: 14   Temp:    SpO2: 93% 93%    Last Pain:  Vitals:   08/12/23 1915  TempSrc:   PainSc: 0-No pain                 [place] Sarinana,W. EDMOND

## 2023-08-12 NOTE — ED Provider Notes (Signed)
 [place] EMERGENCY DEPARTMENT AT [facility] Provider Note   CSN: [id number] Arrival date & time: 08/12/23  1456     History {Add pertinent medical, surgical, social history, OB history to HPI:1} Chief Complaint  Patient presents with   Hip Pain   Dislocation    Austin Webb is a 83 y.o. male.  83 year old male who presents with right hip pain that occurred while bending over.  History of prior right hip prosthetic.  History of prior hip dislocation about a year ago and required patient go to the OR.  Complains of severe pain to his right hip characterized as sharp and worse with movement.  He did hear a pop when this initially happened.  Denies any distal numbness or tingling to his right foot EMS was called patient given 20 mcg of fentanyl and transported here.  This is potentially the second time the patient has had a hip dislocation       Home Medications Prior to Admission medications   Medication Sig Start Date End Date Taking? Authorizing Provider  ALLOPURINOL PO Take 300 mg by mouth daily as needed (FOR GOUT).    [provider]  amLODipine (NORVASC) 5 MG tablet Take 5 mg by mouth daily.    [provider]  aspirin EC 81 MG tablet Take 81 mg by mouth at bedtime.     [provider]  Multiple Vitamin (MULTIVITAMIN WITH MINERALS) TABS tablet Take 1 tablet by mouth daily.    [provider]  pantoprazole (PROTONIX) 40 MG tablet Take 40 mg by mouth every morning.    [provider]  sertraline (ZOLOFT) 100 MG tablet Take 100 mg by mouth at bedtime.     [provider]  tamsulosin (FLOMAX) 0.4 MG CAPS capsule Take 0.4 mg by mouth daily.    [provider]  traMADol (ULTRAM) 50 MG tablet Take 1 tablet (50 mg total) by mouth every 6 (six) hours as needed for moderate pain. 01/31/17   Karie Soda, MD      Allergies    Arthrotec [diclofenac-misoprostol]    Review of Systems   Review of Systems   All other systems reviewed and are negative.   Physical Exam Updated Vital Signs BP (!) 151/85   Pulse 70   Temp 97.7 F (36.5 C) (Oral)   Resp 19   Ht 1.829 m (6')   Wt 93 kg   SpO2 91%   BMI 27.81 kg/m  Physical Exam Vitals and nursing note reviewed.  Constitutional:      General: He is not in acute distress.    Appearance: Normal appearance. He is well-developed. He is not toxic-appearing.  HENT:     Head: Normocephalic and atraumatic.  Eyes:     General: Lids are normal.     Conjunctiva/sclera: Conjunctivae normal.     Pupils: Pupils are equal, round, and reactive to light.  Neck:     Thyroid: No thyroid mass.     Trachea: No tracheal deviation.  Cardiovascular:     Rate and Rhythm: Normal rate and regular rhythm.     Heart sounds: Normal heart sounds. No murmur heard.    No gallop.  Pulmonary:     Effort: Pulmonary effort is normal. No respiratory distress.     Breath sounds: Normal breath sounds. No stridor. No decreased breath sounds, wheezing, rhonchi or rales.  Abdominal:     General: There is no distension.     Palpations: Abdomen is  soft.     Tenderness: There is no abdominal tenderness. There is no rebound.  Musculoskeletal:        General: No tenderness. Normal range of motion.     Cervical back: Normal range of motion and neck supple.     Comments: Right lower extremity shortened and rotated.  Neurovascular intact at at right foot  Skin:    General: Skin is warm and dry.     Findings: No abrasion or rash.  Neurological:     Mental Status: He is alert and oriented to person, place, and time. Mental status is at baseline.     GCS: GCS eye subscore is 4. GCS verbal subscore is 5. GCS motor subscore is 6.     Cranial Nerves: No cranial nerve deficit.     Sensory: No sensory deficit.     Motor: Motor function is intact.  Psychiatric:        Attention and Perception: Attention normal.        Speech: Speech normal.        Behavior: Behavior normal.      ED Results / Procedures / Treatments   Labs (all labs ordered are listed, but only abnormal results are displayed) Labs Reviewed - No data to display  EKG None  Radiology No results found.  Procedures Procedures  {Document cardiac monitor, telemetry assessment procedure when appropriate:1}  Medications Ordered in ED Medications  propofol (DIPRIVAN) 10 mg/mL bolus/IV push 93 mg (has no administration in time range)  ketamine 50 mg in normal saline 5 mL (10 mg/mL) syringe (has no administration in time range)    ED Course/ Medical Decision Making/ A&P   {   Click here for ABCD2, HEART and other calculatorsREFRESH Note before signing :1}                              Medical Decision Making Amount and/or Complexity of Data Reviewed Radiology: ordered.  Risk Prescription drug management.   ***  {Document critical care time when appropriate:1} {Document review of labs and clinical decision tools ie heart score, Chads2Vasc2 etc:1}  {Document your independent review of radiology images, and any outside records:1} {Document your discussion with family members, caretakers, and with consultants:1} {Document social determinants of health affecting pt's care:1} {Document your decision making why or why not admission, treatments were needed:1} Final Clinical Impression(s) / ED Diagnoses Final diagnoses:  None    Rx / DC Orders ED Discharge Orders     None

## 2023-08-12 NOTE — Discharge Instructions (Signed)
 Weightbearing as tolerated right hip Wear knee immobilizer on right leg Follow posterior hip precautions Follow up in office in 1 week with Dr Robby Sermon to make appt

## 2023-08-12 NOTE — H&P (Signed)
 Austin Webb is an 83 y.o. male.   Chief Complaint: Right hip pain HPI: Remote history of right total hip replacement by Dr. Turner Daniels.  History of dislocation in 2023 with reduction in the OR required.  He has done well since then but reached down to tie his boot today and felt his hip pop out of place.  Concern of increased pain and inability to ambulate.  X-ray confirms posterior superior dislocation.  Past Medical History:  Diagnosis Date   Anxiety    Arthritis    Osteoarthritis-left hip, s/p arthroplasty knees, right hip   Cancer (HCC)    basal and squamous cell cancer lesion   Cancer (HCC) 06/04/2000   prostate   Depression    GERD (gastroesophageal reflux disease)    Gout    History of kidney stones    x1 passed on own   Hypertension    Neuromuscular disorder (HCC)    restless leg syndrome   Sleep apnea    no cpap used    Past Surgical History:  Procedure Laterality Date   4 titanium posts for denture placement     APPENDECTOMY     UNKNOWN   BUNIONECTOMY Bilateral 08/02/2012   CATARACT EXTRACTION Left 2003   CATARACT EXTRACTION W/PHACO Right 08/23/2015   Procedure: CATARACT EXTRACTION PHACO AND INTRAOCULAR LENS PLACEMENT (IOC);  Surgeon: Galen Manila, MD;  Location: [facility] ORS;  Service: Ophthalmology;  Laterality: Right;  [place] 1:00 AP% 35.2 CDE 10.11 fluid pack lot # [id number]   CHOLECYSTECTOMY     "stones"- Laparoscopic   ERCP N/A 02/18/2013   Procedure: ENDOSCOPIC RETROGRADE CHOLANGIOPANCREATOGRAPHY (ERCP);  Surgeon: Willis Modena, MD;  Location: [facility];  Service: Endoscopy;  Laterality: N/A;   EUS N/A 02/18/2013   Procedure: ESOPHAGEAL ENDOSCOPIC ULTRASOUND (EUS) RADIAL;  Surgeon: Willis Modena, MD;  Location: [facility];  Service: Endoscopy;  Laterality: N/A;   EYE SURGERY     HAMMER TOE SURGERY Left    and bunionectomy   HAND ARTHROTOMY Right    thumb surgery   HIP ARTHROPLASTY Right    HIP CLOSED REDUCTION Right 01/24/2022   Procedure: CLOSED  MANIPULATION HIP;  Surgeon: Terance Hart, MD;  Location: [facility] ORS;  Service: Orthopedics;  Laterality: Right;   INGUINAL HERNIA REPAIR N/A 01/31/2017   Procedure: LAPAROSCOPIC EXPLORATION AND REPAIR OF BILATERAL INGUINAL HERNIAS;  Surgeon: Karie Soda, MD;  Location: [facility] ORS;  Service: General;  Laterality: N/A;  With MESH   JOINT REPLACEMENT     KNEE ARTHROPLASTY Bilateral 2000   LAPAROSCOPIC APPENDECTOMY N/A 11/03/2013   Procedure: APPENDECTOMY LAPAROSCOPIC/ PRIMARY INCISIONAL HERNIA REPAIR;  Surgeon: Emelia Loron, MD;  Location: [facility] ORS;  Service: General;  Laterality: N/A;   TRANSURETHRAL RESECTION OF PROSTATE      Family History  Problem Relation Age of Onset   Diabetes Mellitus II Neg Hx    CAD Neg Hx    Social History:  reports that he quit smoking about 41 years ago. His smoking use included cigarettes. He started smoking about 53 years ago. He has a 12 pack-year smoking history. He has never used smokeless tobacco. He reports current alcohol use of about 1.0 standard drink of alcohol per week. He reports that he does not use drugs.  Allergies:  Allergies  Allergen Reactions   Arthrotec [Diclofenac-Misoprostol] Diarrhea    Medications Prior to Admission  Medication Sig Dispense Refill   ALLOPURINOL PO Take 300 mg by mouth daily as needed (FOR GOUT).     amLODipine (  NORVASC) 5 MG tablet Take 5 mg by mouth daily.     aspirin EC 81 MG tablet Take 81 mg by mouth at bedtime.      loratadine (CLARITIN) 10 MG tablet Take 10 mg by mouth daily.     Multiple Vitamin (MULTIVITAMIN WITH MINERALS) TABS tablet Take 1 tablet by mouth daily.     pantoprazole (PROTONIX) 40 MG tablet Take 40 mg by mouth every morning.     sertraline (ZOLOFT) 100 MG tablet Take 100 mg by mouth at bedtime.      tamsulosin (FLOMAX) 0.4 MG CAPS capsule Take 0.4 mg by mouth daily.     traMADol (ULTRAM) 50 MG tablet Take 1 tablet (50 mg total) by mouth every 6 (six) hours as needed for moderate pain. 30  tablet 0    No results found for this or any previous visit (from the past 48 hours). No results found.  Review of Systems  All other systems reviewed and are negative.   Blood pressure (!) 151/85, pulse 70, temperature 97.7 F (36.5 C), temperature source Oral, resp. rate 19, height 6' (1.829 m), weight 93 kg, SpO2 91%. Physical Exam Constitutional:      Appearance: He is well-developed.  HENT:     Head: Atraumatic.  Eyes:     Extraocular Movements: Extraocular movements intact.  Cardiovascular:     Pulses: Normal pulses.  Pulmonary:     Effort: Pulmonary effort is normal.  Musculoskeletal:     Comments: Right hip tender.  Right lower extremity shortened and internally rotated.  Distally neurovascularly intact.  Skin:    General: Skin is warm and dry.  Neurological:     Mental Status: He is alert and oriented to person, place, and time.  Psychiatric:        Mood and Affect: Mood normal.      Assessment/Plan Right hip recurrent posterior dislocation. We will take him to the operating room tonight for close reduction under anesthesia. I will speak with Dr. Turner Daniels about this patient and he will follow-up with Dr. Carlean Jews moving forward.  Glennon Hamilton, MD 08/12/2023, 6:07 PM

## 2023-08-12 NOTE — Progress Notes (Addendum)
 Respiratory Therapist at [facility]  in room number __22__ during procedure.  Suction with [place] at [facility] set up and ready to use. Ambu bag at [employer] and ready to use.  Patient placed on ETCO2 Nasal Cannula at _2___ LPM.  Vitals at conclusion of procedure:  ETCO2 ____ mmHg HR RR SPO2   Patient awake and able to verbalize name.

## 2023-08-12 NOTE — Anesthesia Procedure Notes (Signed)
 Procedure Name: Intubation Date/Time: 08/12/2023 6:42 PM  Performed by: Maurene Capes, CRNAPre-anesthesia Checklist: Patient identified, Emergency Drugs available, Suction available and Patient being monitored Patient Re-evaluated:Patient Re-evaluated prior to induction Oxygen Delivery Method: Circle System Utilized Preoxygenation: Pre-oxygenation with 100% oxygen Induction Type: IV induction Ventilation: Mask ventilation without difficulty Laryngoscope Size: Glidescope and 4 Grade View: Grade II Tube type: Oral Tube size: 7.5 mm Number of attempts: 1 Airway Equipment and Method: Rigid stylet Placement Confirmation: ETT inserted through vocal cords under direct vision, positive ETCO2 and breath sounds checked- equal and bilateral Secured at: 21 cm Tube secured with: Tape Dental Injury: Teeth and Oropharynx as per pre-operative assessment

## 2023-08-12 NOTE — Transfer of Care (Signed)
 Immediate Anesthesia Transfer of Care Note  Patient: Austin Webb  Procedure(s) Performed: CLOSED REDUCTION, HIP (Right: Hip)  Patient Location: PACU  Anesthesia Type:General  Level of Consciousness: awake, alert , and oriented  Airway & Oxygen Therapy: Patient Spontanous Breathing and Patient connected to nasal cannula oxygen  Post-op Assessment: Report given to RN and Post -op Vital signs reviewed and stable  Post vital signs: Reviewed and stable  Last Vitals:  Vitals Value Taken Time  BP 144/79 08/12/23 1849  Temp    Pulse 80 08/12/23 1853  Resp 22 08/12/23 1853  SpO2 95 % 08/12/23 1853  Vitals shown include unfiled device data.  Last Pain:  Vitals:   08/12/23 1811  TempSrc:   PainSc: 8       Patients Stated Pain Goal: 2 (08/12/23 1811)  Complications: No notable events documented.

## 2023-08-13 ENCOUNTER — Encounter (HOSPITAL_COMMUNITY): Payer: Self-pay | Admitting: Orthopedic Surgery

## 2023-08-13 NOTE — Op Note (Signed)
 Preoperative diagnosis: Right prosthetic hip dislocation  Postoperative diagnosis: Same   Procedure performed: Closed reduction right prosthetic hip dislocation  Surgeon: Berline Lopes, MD  Assistant: Fredia Sorrow, PA-C  Complications: None  EBL: None  Indications for procedure: The patient has a remote history of right total hip arthroplasty by Dr. Turner Daniels with dislocation event in 2023.  He bent down to tie his boot today and felt his hip go out of place.  He presented to the emergency department with pain and inability to ambulate.  Procedure: After anesthesia was induced without complication the patient was moved to the operative bed where he was kept in the supine position.  With downward pressure on the anterior superior iliac spine the hip was reduced with upward and distal force.  Reduction was not overly difficult.  Once the hip was then felt stable to gentle rotation.  Fluoroscopic imaging was used to verify the reduction.  Knee immobilizer was placed and the patient was allowed to awaken from anesthesia transferred to the stretcher and taken back to the recovery room in stable condition  Postoperative plan: He will be discharged home with family and will follow-up with Dr. Turner Daniels in about a week for reevaluation.

## 2023-08-20 DIAGNOSIS — M25551 Pain in right hip: Secondary | ICD-10-CM | POA: Diagnosis not present

## 2023-08-20 DIAGNOSIS — T8484XD Pain due to internal orthopedic prosthetic devices, implants and grafts, subsequent encounter: Secondary | ICD-10-CM | POA: Diagnosis not present

## 2023-09-03 DIAGNOSIS — R7309 Other abnormal glucose: Secondary | ICD-10-CM | POA: Diagnosis not present

## 2023-09-03 DIAGNOSIS — F329 Major depressive disorder, single episode, unspecified: Secondary | ICD-10-CM | POA: Diagnosis not present

## 2023-09-03 DIAGNOSIS — Z01810 Encounter for preprocedural cardiovascular examination: Secondary | ICD-10-CM | POA: Diagnosis not present

## 2023-09-03 DIAGNOSIS — Z79899 Other long term (current) drug therapy: Secondary | ICD-10-CM | POA: Diagnosis not present

## 2023-09-03 DIAGNOSIS — Z125 Encounter for screening for malignant neoplasm of prostate: Secondary | ICD-10-CM | POA: Diagnosis not present

## 2023-09-03 DIAGNOSIS — N400 Enlarged prostate without lower urinary tract symptoms: Secondary | ICD-10-CM | POA: Diagnosis not present

## 2023-09-03 DIAGNOSIS — E7800 Pure hypercholesterolemia, unspecified: Secondary | ICD-10-CM | POA: Diagnosis not present

## 2023-09-03 DIAGNOSIS — I10 Essential (primary) hypertension: Secondary | ICD-10-CM | POA: Diagnosis not present

## 2023-09-03 DIAGNOSIS — K219 Gastro-esophageal reflux disease without esophagitis: Secondary | ICD-10-CM | POA: Diagnosis not present

## 2023-09-03 DIAGNOSIS — M109 Gout, unspecified: Secondary | ICD-10-CM | POA: Diagnosis not present

## 2023-09-09 IMAGING — DX DG RIBS W/ CHEST 3+V*R*
5 series · 5 of 5 positions shown · non-contrast
Comparison: Chest radiograph done on 02/01/2017

CLINICAL DATA: Intercostal pain x5 days

EXAM:
RIGHT RIBS AND CHEST - 3+ VIEW

[w chest pa]
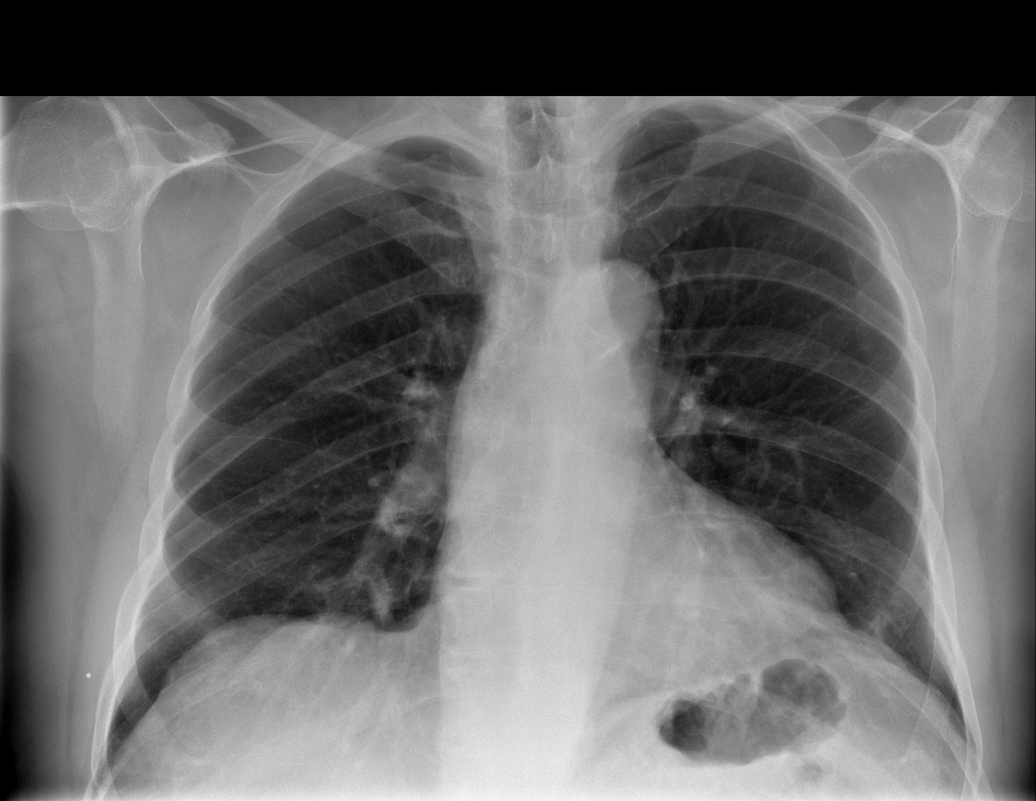

[w ribs pa upper right]
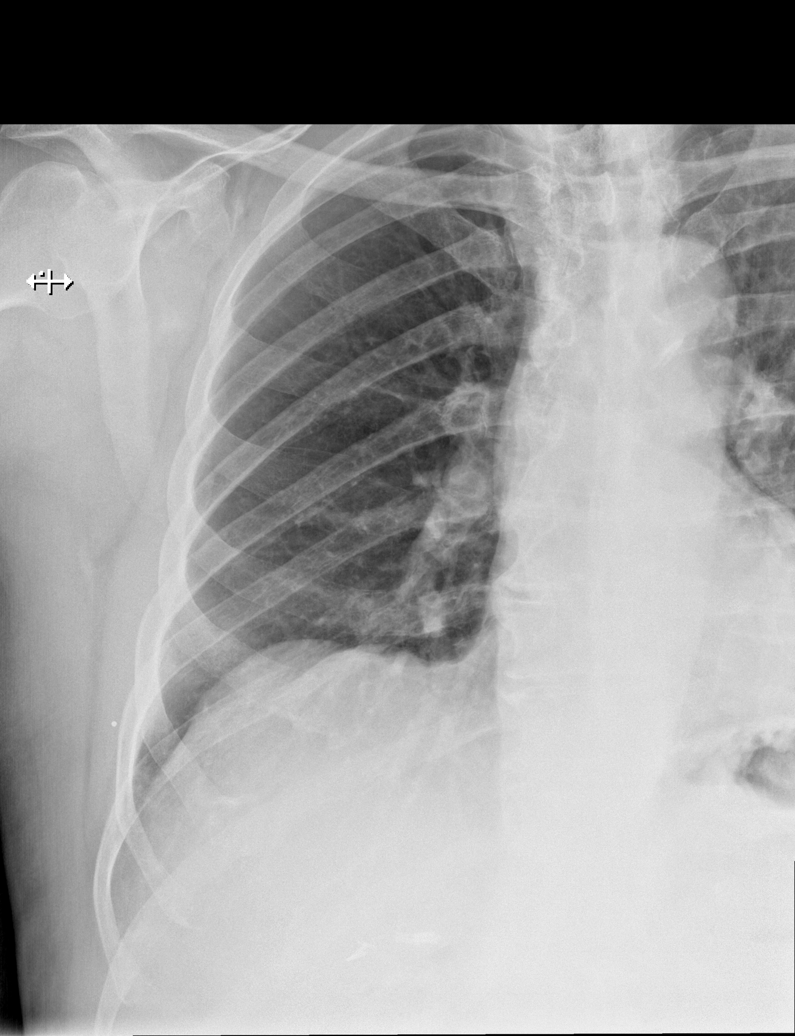

[w ribs pa lower right]
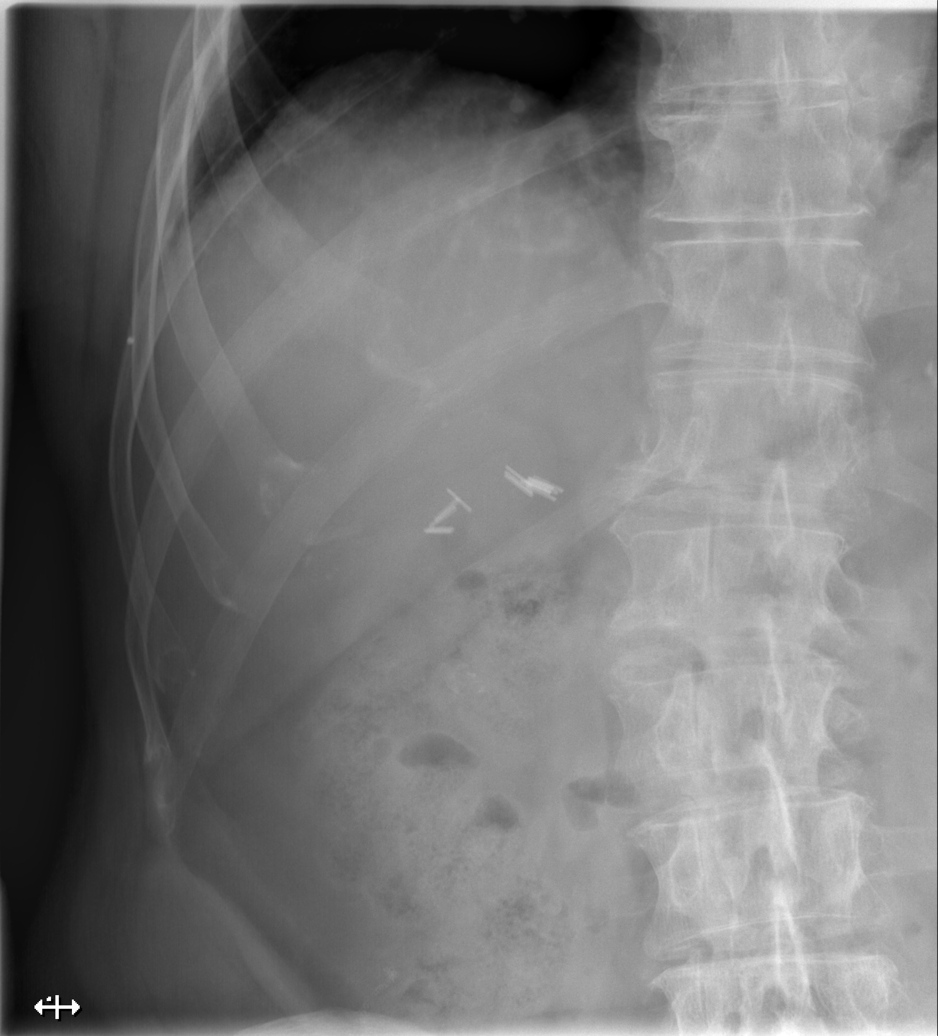

[w ribs obl right (1 of 2)]
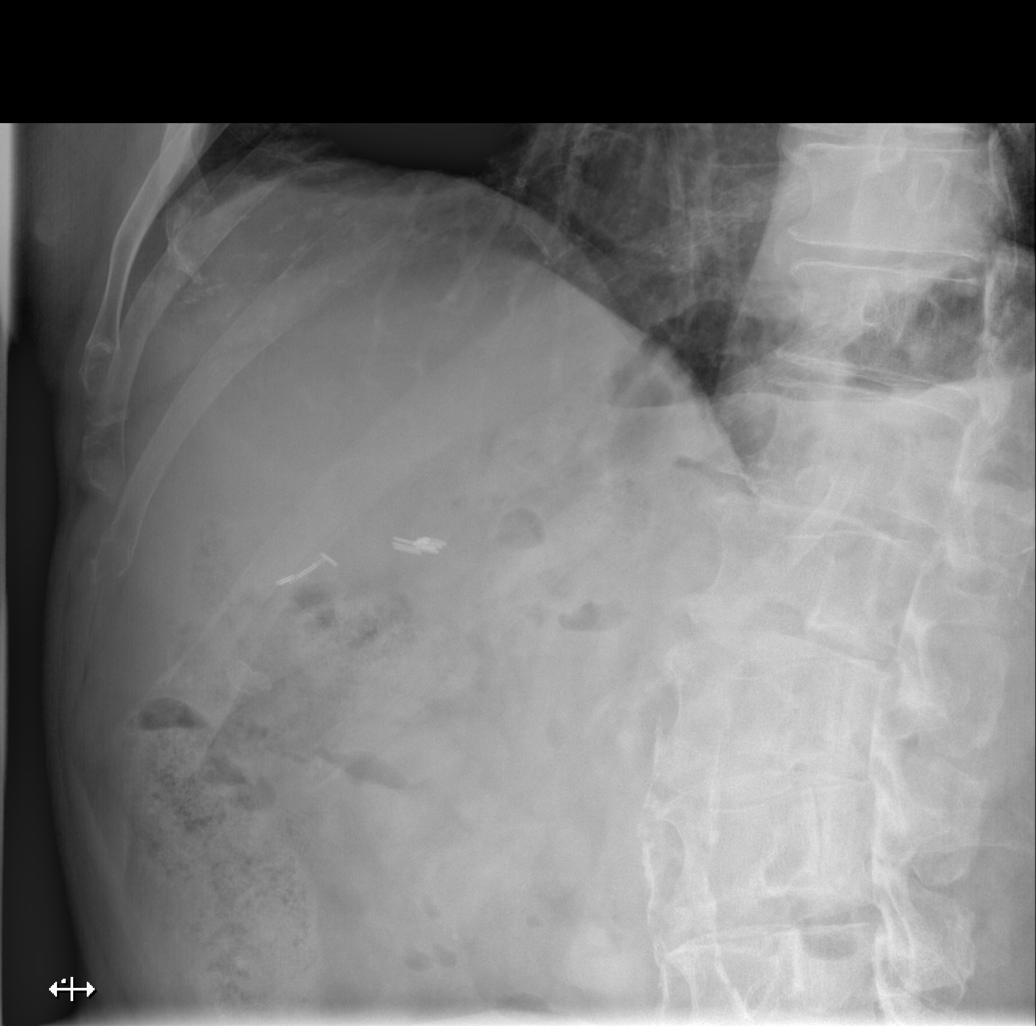

[w ribs obl right (2 of 2)]
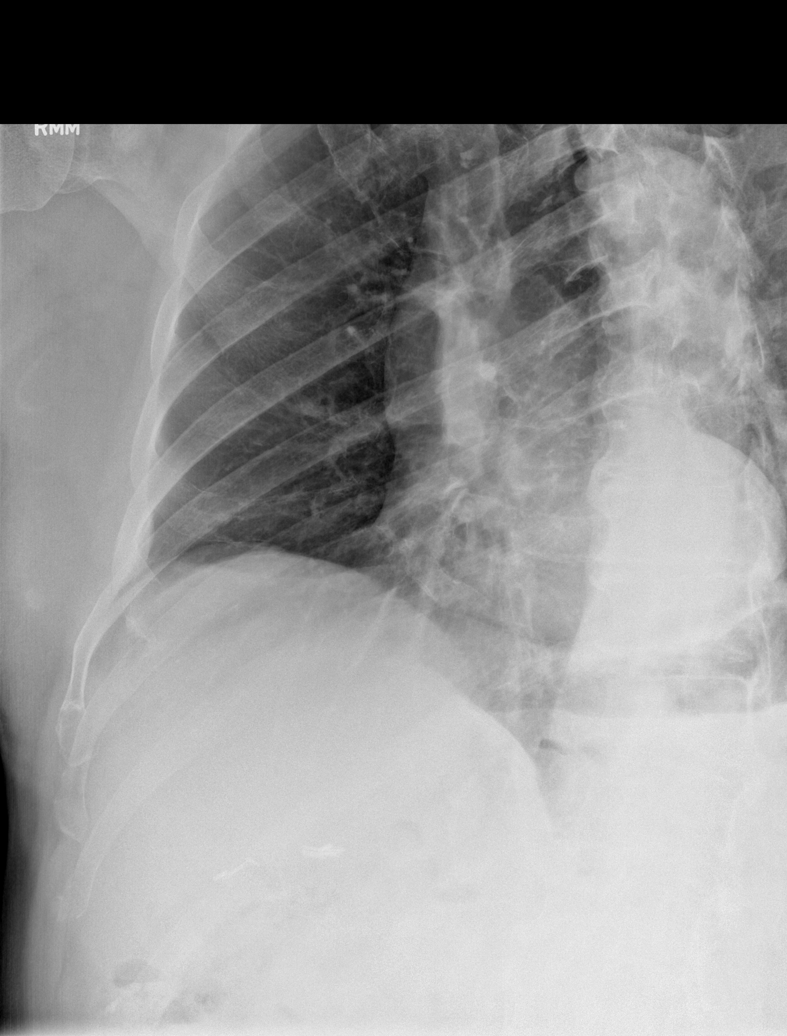

[5 of 5 positions shown; findings below may reference images not displayed]

FINDINGS: Cardiac size is in the upper limits of normal. There are no signs of
pulmonary edema or focal pulmonary consolidation. There is no
pleural effusion or pneumothorax. No displaced fractures are seen in
the right ribs. In the some of the images, there is faint sclerosis
in the anterior aspect of right sixth rib without break in the
cortical margins. Surgical clips are seen in gallbladder fossa.
Arterial calcifications are seen.
IMPRESSION: There are no focal pulmonary infiltrates. There is no pleural
effusion or pneumothorax.

No displaced fractures are seen in the right ribs. There is faint
sclerosis in the anterior aspect of right sixth rib without break in
the cortical margins which may be a technical artifact or suggest
deformity from previous injury. Less likely possibility would be
active inflammatory or neoplastic process. If symptoms persist
follow-up CT chest may be considered.

## 2023-09-12 ENCOUNTER — Other Ambulatory Visit: Payer: Self-pay | Admitting: Orthopedic Surgery

## 2023-09-12 IMAGING — CT CT CHEST W/O CM
1 of 2 series · 15 of 32 positions shown, 19 images · non-contrast
Comparison: None Available.

EXAM:
CT CHEST WITHOUT CONTRAST
TECHNIQUE: Multidetector CT imaging of the chest was performed following the
standard protocol without IV contrast.

RADIATION DOSE REDUCTION: This exam was performed according to the
departmental dose-optimization program which includes automated
exposure control, adjustment of the mA and/or kV according to
patient size and/or use of iterative reconstruction technique.

[Series 7: super d · axial · 0.84mm/px · z∈[-342,+28]mm · 15 of 519 slices shown, 19 images]
[im 28/519  mediastinal]
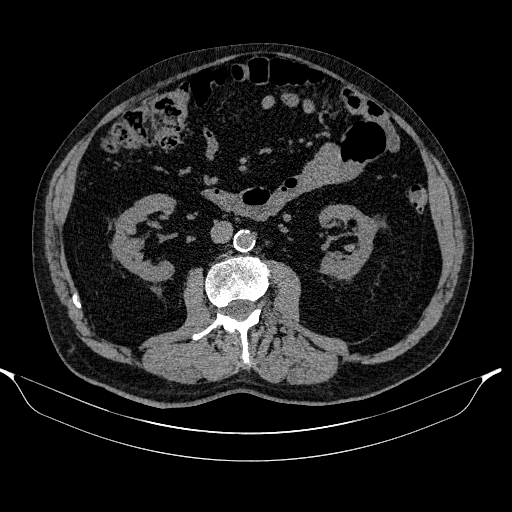
[im 28/519  lung]
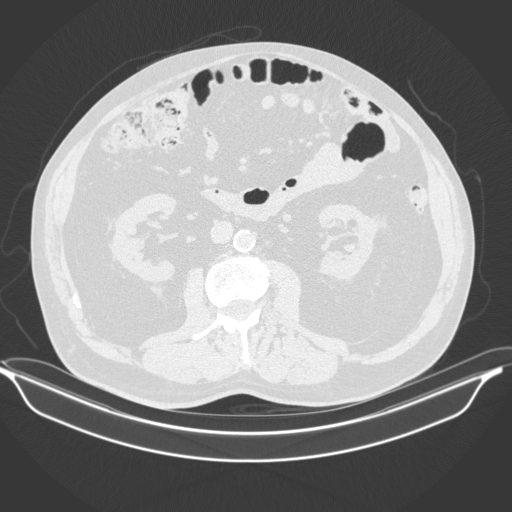
[im 82/519  lung]
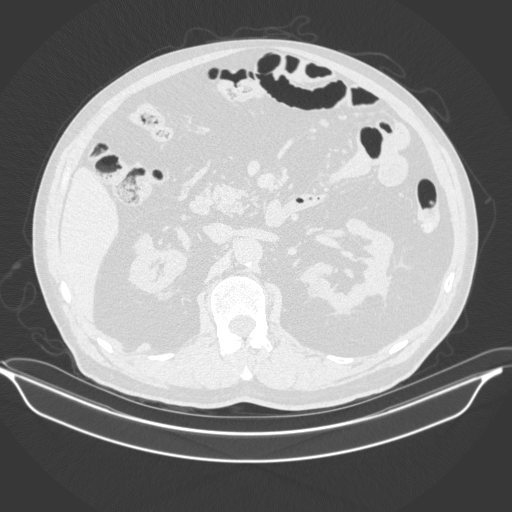
[im 110/519  lung]
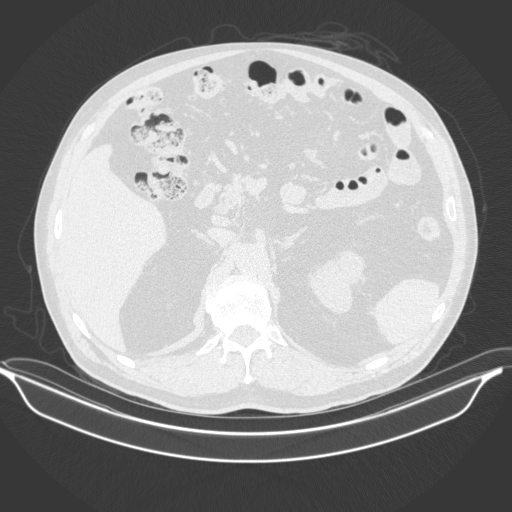
[im 137/519  lung]
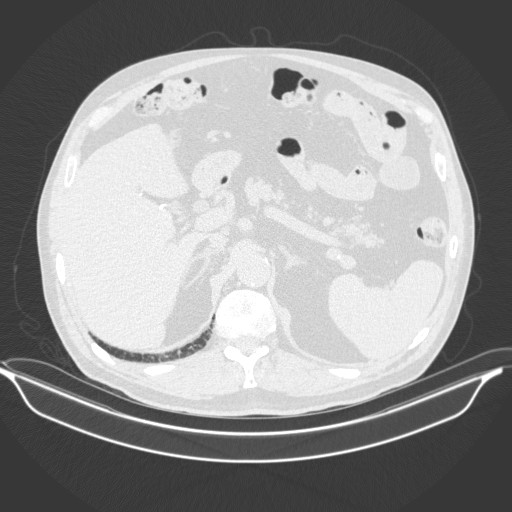
[im 173/519  mediastinal]
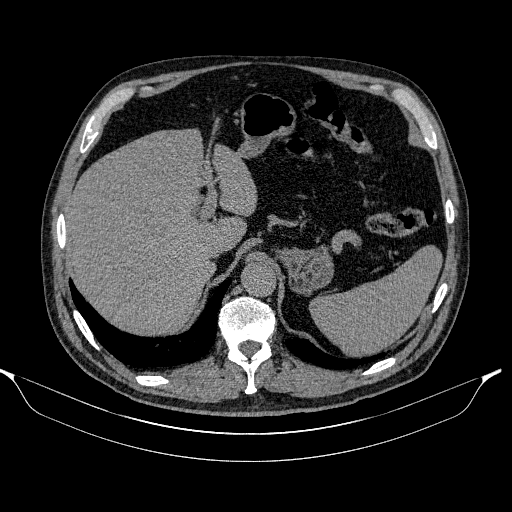
[im 173/519  lung]
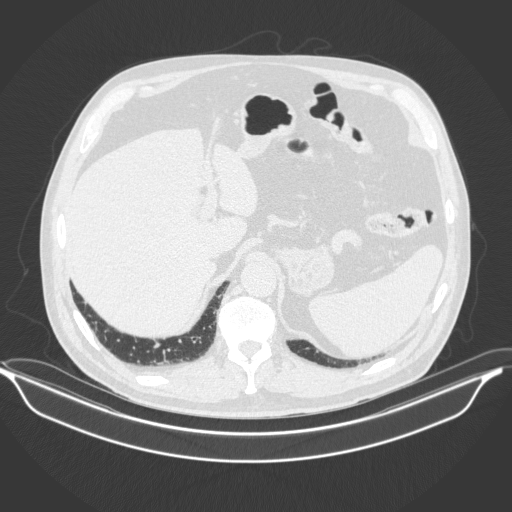
[im 191/519  lung]
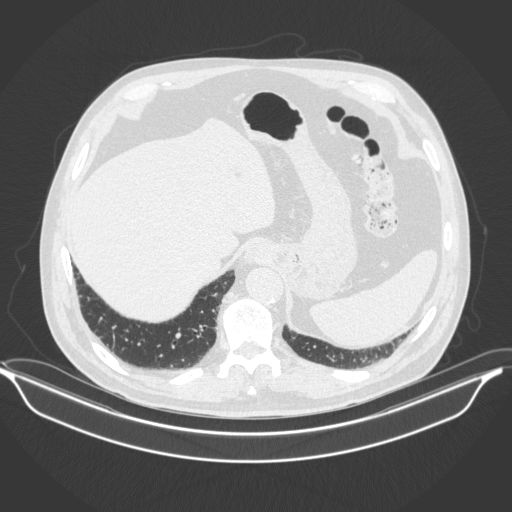
[im 246/519  lung]
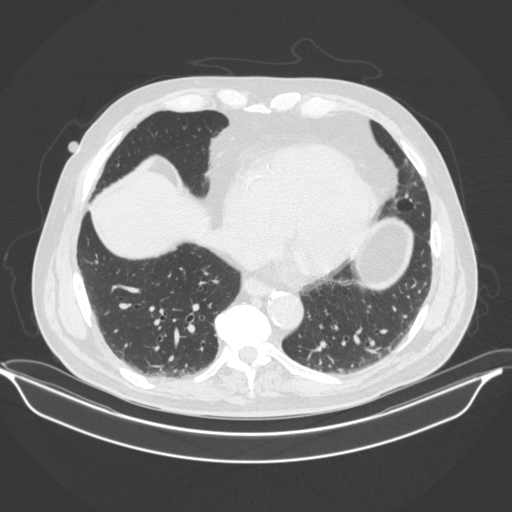
[im 248/519  lung]
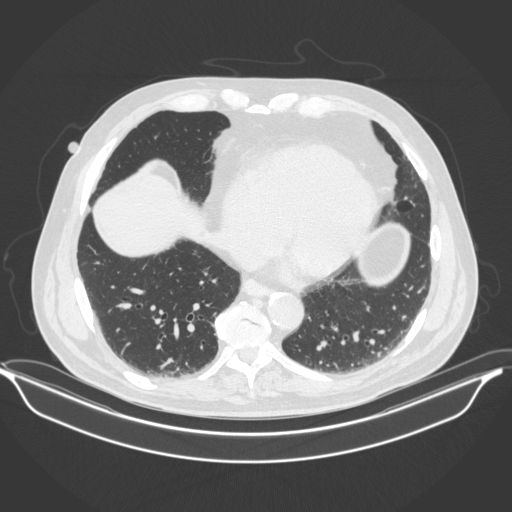
[im 273/519  mediastinal]
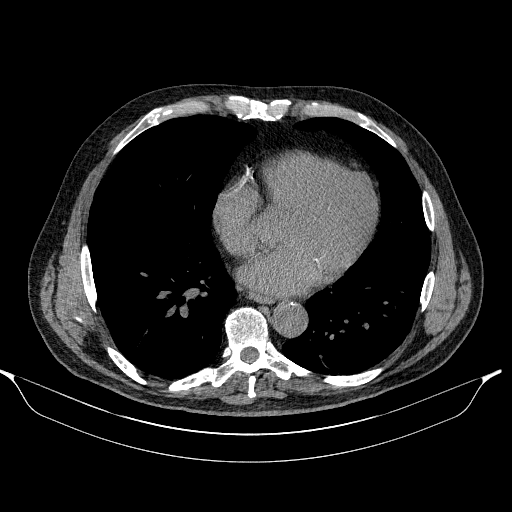
[im 273/519  lung]
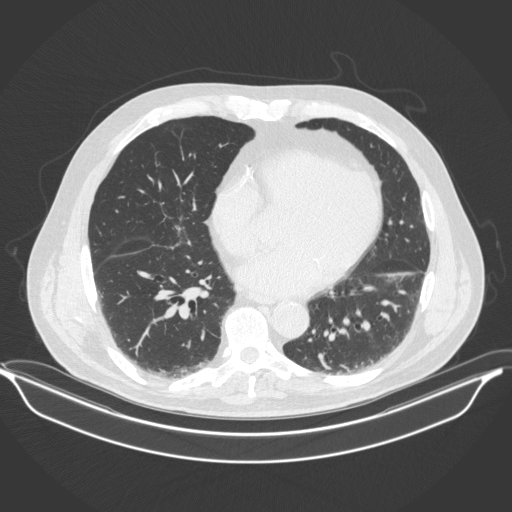
[im 328/519  lung]
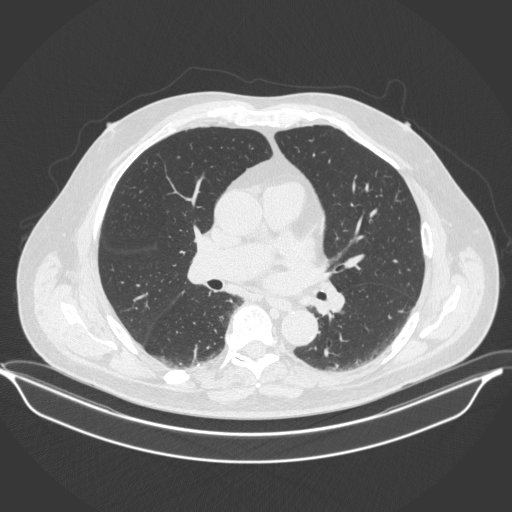
[im 346/519  lung]
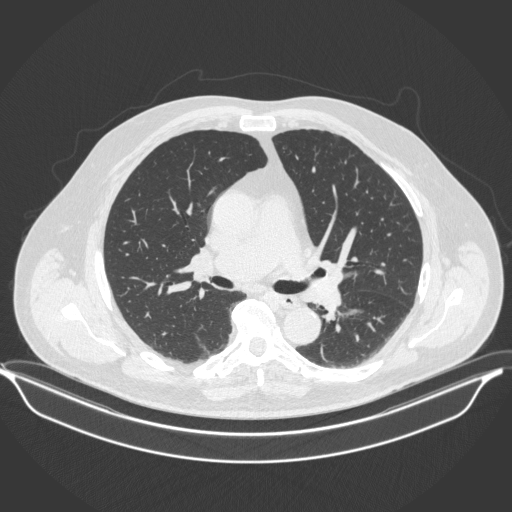
[im 382/519  lung]
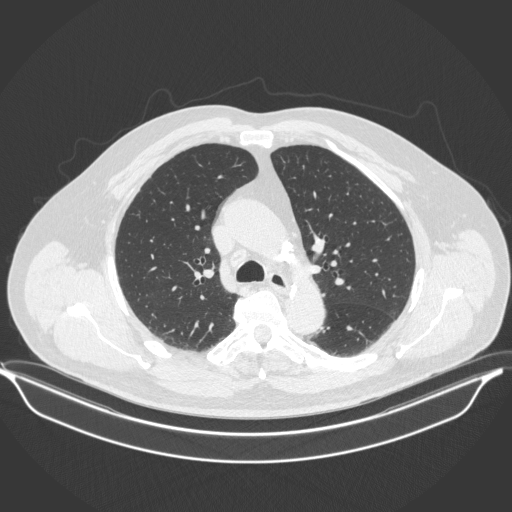
[im 409/519  mediastinal]
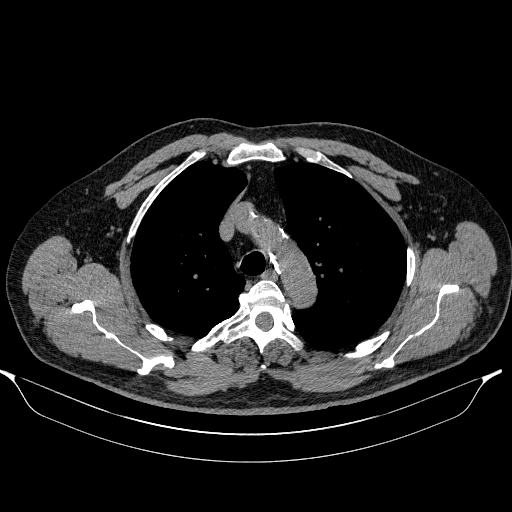
[im 409/519  lung]
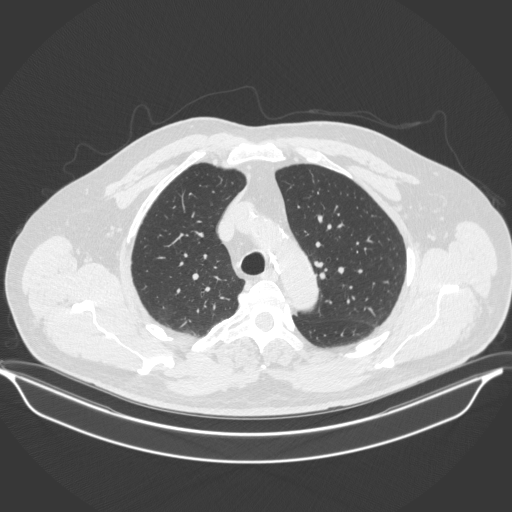
[im 437/519  lung]
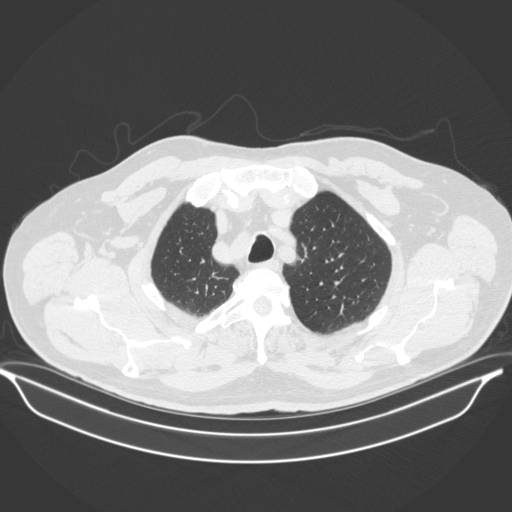
[im 491/519  lung]
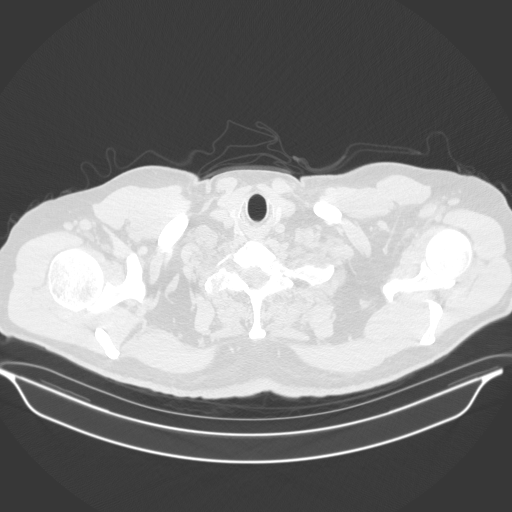

[15 of 32 positions shown; findings below may reference images not displayed]

FINDINGS: Cardiovascular: Coronary artery and aortic atherosclerosis. Normal
heart size. No pericardial effusion.

Mediastinum/Nodes: No enlarged mediastinal or axillary lymph nodes.
Thyroid gland, trachea, and esophagus demonstrate no significant
findings.

Lungs/Pleura: No consolidation. Mild left basilar subsegmental
atelectasis. Proximally 4 mm pulmonary nodule in the right lower
lobe (series 6, image 113) no pleural effusion or pneumothorax.

Upper Abdomen: No acute findings. Bilateral renal scarring.
Cholecystectomy.

Musculoskeletal: At the site of pain there is a healing nondisplaced
right lateral sixth rib fracture (see series 6, image 109).
IMPRESSION: 1. At the site of pain there is a healing nondisplaced right lateral
sixth rib fracture.
2. Approximately 4 mm pulmonary nodule in the right lower lobe. No
follow-up needed if patient is low-risk.This recommendation follows
the consensus statement: Guidelines for Management of Incidental
Pulmonary Nodules Detected on CT Images: From the [HOSPITAL]
3. Aortic Atherosclerosis (ESMY4-X2R.R).

## 2023-10-01 NOTE — Patient Instructions (Signed)
 DUE TO COVID-19 ONLY TWO VISITORS  (aged 84 and older)  ARE ALLOWED TO COME WITH YOU AND STAY IN THE WAITING ROOM ONLY DURING PRE OP AND PROCEDURE.   **NO VISITORS ARE ALLOWED IN THE SHORT STAY AREA OR RECOVERY ROOM!!**  IF YOU WILL BE ADMITTED INTO THE HOSPITAL YOU ARE ALLOWED ONLY FOUR SUPPORT PEOPLE DURING VISITATION HOURS ONLY (7 AM -8PM)   The support person(s) must pass our screening, gel in and out, and wear a mask at all times, including in the patient's room. Patients must also wear a mask when staff or their support person are in the room. Visitors GUEST BADGE MUST BE WORN VISIBLY  One adult visitor may remain with you overnight and MUST be in the room by 8 P.M.     Your procedure is scheduled on: 10/14/23   Report to [facility] Main Entrance    Report to admitting at: 5:15 AM   Call this number if you have problems the morning of surgery [phone]   Do not eat food :After Midnight.   After Midnight you may have the following liquids until : 4:15 AM DAY OF SURGERY  Water Black Coffee (sugar ok, NO MILK/CREAM OR CREAMERS)  Tea (sugar ok, NO MILK/CREAM OR CREAMERS) regular and decaf                             Plain Jell-O (NO RED)                                           Fruit ices (not with fruit pulp, NO RED)                                     Popsicles (NO RED)                                                                  Juice: apple, WHITE grape, WHITE cranberry Sports drinks like Gatorade (NO RED)   The day of surgery:  Drink ONE (1) Pre-Surgery Clear Ensure at : 4:15 AM the morning of surgery. Drink in one sitting. Do not sip.  This drink was given to you during your hospital  pre-op appointment visit. Nothing else to drink after completing the  Pre-Surgery Clear Ensure or G2.          If you have questions, please contact your surgeon's office.  FOLLOW ANY ADDITIONAL PRE OP INSTRUCTIONS YOU RECEIVED FROM YOUR SURGEON'S OFFICE!!!   Oral  Hygiene is also important to reduce your risk of infection.                                    Remember - BRUSH YOUR TEETH THE MORNING OF SURGERY WITH YOUR REGULAR TOOTHPASTE  DENTURES WILL BE REMOVED PRIOR TO SURGERY PLEASE DO NOT APPLY "Poly grip" OR ADHESIVES!!!   Do NOT smoke after Midnight   Take these medicines the morning of surgery with  A SIP OF WATER: sertraline ,amlodipine ,allopurinol,pantoprazole .  STOP TAKING all Vitamins, Herbs and supplements 1 week before your surgery.  Bring CPAP mask and tubing day of surgery.                              You may not have any metal on your body including hair pins, jewelry, and body piercing             Do not wear lotions, powders, perfumes/cologne, or deodorant              Men may shave face and neck.   Do not bring valuables to the hospital. [facility] IS NOT             RESPONSIBLE   FOR VALUABLES.   Contacts, glasses, or bridgework may not be worn into surgery.   Bring small overnight bag day of surgery.   DO NOT BRING YOUR HOME MEDICATIONS TO THE HOSPITAL. PHARMACY WILL DISPENSE MEDICATIONS LISTED ON YOUR MEDICATION LIST TO YOU DURING YOUR ADMISSION IN THE HOSPITAL!    Patients discharged on the day of surgery will not be allowed to drive home.  Someone NEEDS to stay with you for the first 24 hours after anesthesia.   Special Instructions: Bring a copy of your healthcare power of attorney and living will documents         the day of surgery if you haven't scanned them before.              Please read over the following fact sheets you were given: IF YOU HAVE QUESTIONS ABOUT YOUR PRE-OP INSTRUCTIONS PLEASE CALL [phone]      Pre-operative 5 CHG Bath Instructions   You can play a key role in reducing the risk of infection after surgery. Your skin needs to be as free of germs as possible. You can reduce the number of germs on your skin by washing with CHG (chlorhexidine  gluconate) soap before surgery. CHG is an  antiseptic soap that kills germs and continues to kill germs even after washing.   DO NOT use if you have an allergy to chlorhexidine /CHG or antibacterial soaps. If your skin becomes reddened or irritated, stop using the CHG and notify one of our RNs at [phone].   Please shower with the CHG soap starting 4 days before surgery using the following schedule:     Please keep in mind the following:  DO NOT shave, including legs and underarms, starting the day of your first shower.   You may shave your face at any point before/day of surgery.  Place clean sheets on your bed the day you start using CHG soap. Use a clean washcloth (not used since being washed) for each shower. DO NOT sleep with pets once you start using the CHG.   CHG Shower Instructions:  If you choose to wash your hair and private area, wash first with your normal shampoo/soap.  After you use shampoo/soap, rinse your hair and body thoroughly to remove shampoo/soap residue.  Turn the water OFF and apply about 3 tablespoons (45 ml) of CHG soap to a CLEAN washcloth.  Apply CHG soap ONLY FROM YOUR NECK DOWN TO YOUR TOES (washing for 3-5 minutes)  DO NOT use CHG soap on face, private areas, open wounds, or sores.  Pay special attention to the area where your surgery is being performed.  If you are having back surgery, having someone wash your back  for you may be helpful. Wait 2 minutes after CHG soap is applied, then you may rinse off the CHG soap.  Pat dry with a clean towel  Put on clean clothes/pajamas   If you choose to wear lotion, please use ONLY the CHG-compatible lotions on the back of this paper.     Additional instructions for the day of surgery: DO NOT APPLY any lotions, deodorants, cologne, or perfumes.   Put on clean/comfortable clothes.  Brush your teeth.  Ask your nurse before applying any prescription medications to the skin.   CHG Compatible Lotions   Aveeno Moisturizing lotion  Cetaphil Moisturizing  Cream  Cetaphil Moisturizing Lotion  Clairol Herbal Essence Moisturizing Lotion, Dry Skin  Clairol Herbal Essence Moisturizing Lotion, Extra Dry Skin  Clairol Herbal Essence Moisturizing Lotion, Normal Skin  Curel Age Defying Therapeutic Moisturizing Lotion with Alpha Hydroxy  Curel Extreme Care Body Lotion  Curel Soothing Hands Moisturizing Hand Lotion  Curel Therapeutic Moisturizing Cream, Fragrance-Free  Curel Therapeutic Moisturizing Lotion, Fragrance-Free  Curel Therapeutic Moisturizing Lotion, Original Formula  Eucerin Daily Replenishing Lotion  Eucerin Dry Skin Therapy Plus Alpha Hydroxy Crme  Eucerin Dry Skin Therapy Plus Alpha Hydroxy Lotion  Eucerin Original Crme  Eucerin Original Lotion  Eucerin Plus Crme Eucerin Plus Lotion  Eucerin TriLipid Replenishing Lotion  Keri Anti-Bacterial Hand Lotion  Keri Deep Conditioning Original Lotion Dry Skin Formula Softly Scented  Keri Deep Conditioning Original Lotion, Fragrance Free Sensitive Skin Formula  Keri Lotion Fast Absorbing Fragrance Free Sensitive Skin Formula  Keri Lotion Fast Absorbing Softly Scented Dry Skin Formula  Keri Original Lotion  Keri Skin Renewal Lotion Keri Silky Smooth Lotion  Keri Silky Smooth Sensitive Skin Lotion  Nivea Body Creamy Conditioning Oil  Nivea Body Extra Enriched Lotion  Nivea Body Original Lotion  Nivea Body Sheer Moisturizing Lotion Nivea Crme  Nivea Skin Firming Lotion  NutraDerm 30 Skin Lotion  NutraDerm Skin Lotion  NutraDerm Therapeutic Skin Cream  NutraDerm Therapeutic Skin Lotion  ProShield Protective Hand Cream  Provon moisturizing lotion   Incentive Spirometer  An incentive spirometer is a tool that can help keep your lungs clear and active. This tool measures how well you are filling your lungs with each breath. Taking long deep breaths may help reverse or decrease the chance of developing breathing (pulmonary) problems (especially infection) following: A long period of  time when you are unable to move or be active. BEFORE THE PROCEDURE  If the spirometer includes an indicator to show your best effort, your nurse or respiratory therapist will set it to a desired goal. If possible, sit up straight or lean slightly forward. Try not to slouch. Hold the incentive spirometer in an upright position. INSTRUCTIONS FOR USE  Sit on the edge of your bed if possible, or sit up as far as you can in bed or on a chair. Hold the incentive spirometer in an upright position. Breathe out normally. Place the mouthpiece in your mouth and seal your lips tightly around it. Breathe in slowly and as deeply as possible, raising the piston or the ball toward the top of the column. Hold your breath for 3-5 seconds or for as long as possible. Allow the piston or ball to fall to the bottom of the column. Remove the mouthpiece from your mouth and breathe out normally. Rest for a few seconds and repeat Steps 1 through 7 at least 10 times every 1-2 hours when you are awake. Take your time and take a few normal  breaths between deep breaths. The spirometer may include an indicator to show your best effort. Use the indicator as a goal to work toward during each repetition. After each set of 10 deep breaths, practice coughing to be sure your lungs are clear. If you have an incision (the cut made at the time of surgery), support your incision when coughing by placing a pillow or rolled up towels firmly against it. Once you are able to get out of bed, walk around indoors and cough well. You may stop using the incentive spirometer when instructed by your caregiver.  RISKS AND COMPLICATIONS Take your time so you do not get dizzy or light-headed. If you are in pain, you may need to take or ask for pain medication before doing incentive spirometry. It is harder to take a deep breath if you are having pain. AFTER USE Rest and breathe slowly and easily. It can be helpful to keep track of a log of your  progress. Your caregiver can provide you with a simple table to help with this. If you are using the spirometer at home, follow these instructions: SEEK MEDICAL CARE IF:  You are having difficultly using the spirometer. You have trouble using the spirometer as often as instructed. Your pain medication is not giving enough relief while using the spirometer. You develop fever of 100.5 F (38.1 C) or higher. SEEK IMMEDIATE MEDICAL CARE IF:  You cough up bloody sputum that had not been present before. You develop fever of 102 F (38.9 C) or greater. You develop worsening pain at or near the incision site. MAKE SURE YOU:  Understand these instructions. Will watch your condition. Will get help right away if you are not doing well or get worse. Document Released: 10/01/2006 Document Revised: 08/13/2011 Document Reviewed: 12/02/2006 [facility] Patient Information 2014 [place], [place].   ________________________________________________________________________

## 2023-10-02 ENCOUNTER — Encounter (HOSPITAL_COMMUNITY)
Admission: RE | Admit: 2023-10-02 | Discharge: 2023-10-02 | Disposition: A | Discharge status: Home / Self Care | Source: Ambulatory Visit | Attending: Orthopedic Surgery | Admitting: Orthopedic Surgery

## 2023-10-02 ENCOUNTER — Encounter (HOSPITAL_COMMUNITY): Payer: Self-pay

## 2023-10-02 ENCOUNTER — Other Ambulatory Visit: Payer: Self-pay

## 2023-10-02 VITALS — BP 141/80 | HR 68 | Temp 97.7°F | Ht 72.0 in | Wt 207.0 lb

## 2023-10-02 DIAGNOSIS — R7309 Other abnormal glucose: Secondary | ICD-10-CM | POA: Diagnosis not present

## 2023-10-02 DIAGNOSIS — M109 Gout, unspecified: Secondary | ICD-10-CM | POA: Diagnosis not present

## 2023-10-02 DIAGNOSIS — I10 Essential (primary) hypertension: Secondary | ICD-10-CM | POA: Diagnosis not present

## 2023-10-02 DIAGNOSIS — R946 Abnormal results of thyroid function studies: Secondary | ICD-10-CM | POA: Diagnosis not present

## 2023-10-02 DIAGNOSIS — F329 Major depressive disorder, single episode, unspecified: Secondary | ICD-10-CM | POA: Diagnosis not present

## 2023-10-02 DIAGNOSIS — N400 Enlarged prostate without lower urinary tract symptoms: Secondary | ICD-10-CM | POA: Diagnosis not present

## 2023-10-02 DIAGNOSIS — Z01818 Encounter for other preprocedural examination: Secondary | ICD-10-CM

## 2023-10-02 DIAGNOSIS — Z01812 Encounter for preprocedural laboratory examination: Secondary | ICD-10-CM | POA: Diagnosis present

## 2023-10-02 DIAGNOSIS — K219 Gastro-esophageal reflux disease without esophagitis: Secondary | ICD-10-CM | POA: Diagnosis not present

## 2023-10-02 DIAGNOSIS — Z0000 Encounter for general adult medical examination without abnormal findings: Secondary | ICD-10-CM | POA: Diagnosis not present

## 2023-10-02 DIAGNOSIS — H6121 Impacted cerumen, right ear: Secondary | ICD-10-CM | POA: Diagnosis not present

## 2023-10-02 LAB — SURGICAL PCR SCREEN
MRSA, PCR: NEGATIVE
Staphylococcus aureus: NEGATIVE

## 2023-10-02 NOTE — Progress Notes (Signed)
 For Anesthesia: PCP - Pete Brand, DO  Cardiologist - N/A  Bowel Prep reminder:  Chest x-ray -  EKG - 08/12/23 Stress Test -  ECHO -  Cardiac Cath -  Pacemaker/ICD device last checked: Pacemaker orders received: Device Rep notified:  Spinal Cord Stimulator: N/A  Sleep Study - Yes CPAP - Yes  Fasting Blood Sugar -  N/A Checks Blood Sugar _____ times a day Date and result of last Hgb A1c-  Last dose of GLP1 agonist-  N/A GLP1 instructions:   Last dose of SGLT-2 inhibitors-  N/A SGLT-2 instructions:   Blood Thinner Instructions: Aspirin  Instructions: Last Dose:  Activity level: Can go up a flight of stairs and activities of daily living without stopping and without chest pain and/or shortness of breath   Able to exercise without chest pain and/or shortness of breath  Anesthesia review:  Hx: HTN,OSA(CPAP).  Patient denies shortness of breath, fever, cough and chest pain at PAT appointment   Patient verbalized understanding of instructions that were given to them at the PAT appointment. Patient was also instructed that they will need to review over the PAT instructions again at home before surgery.

## 2023-10-03 ENCOUNTER — Encounter (HOSPITAL_COMMUNITY)

## 2023-10-09 DIAGNOSIS — T84029A Dislocation of unspecified internal joint prosthesis, initial encounter: Secondary | ICD-10-CM

## 2023-10-09 NOTE — H&P (Signed)
 TOTAL HIP REVISION ADMISSION H&P  Patient is admitted for right revision total hip arthroplasty.  Subjective:  Chief Complaint: right hip pain  HPI: Austin Webb, 83 y.o. male, has a history of pain and functional disability in the right hip due to  recurrent dislocations  and patient has failed non-surgical conservative treatments for greater than 12 weeks to include NSAID's and/or analgesics, supervised PT with diminished ADL's post treatment, and activity modification. The indications for the revision total hip arthroplasty are  recurrent dislocations .  Onset of symptoms was abrupt starting 1 years ago with rapidlly worsening course since that time.  Prior procedures on the right hip include arthroplasty.  Patient currently rates pain in the right hip at 10 out of 10 with activity.  There is pain that interfers with activities of daily living. Patient has evidence of  recurrent dislocations  by imaging studies.  This condition presents safety issues increasing the risk of falls.  There is no current active infection.  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) 10/09/2023   Dizziness 02/01/2017   Difficulty urinating 02/01/2017   Bilious emesis 02/01/2017   Bilateral inguinal hernias s/p lap repair with mesh 01/31/2017 01/31/2017   Umbilical hernia s/p primary repair 01/31/2017 01/31/2017   S/P laparoscopic appendectomy 11/03/2013   HTN (hypertension) 10/13/2013   Anemia 10/13/2013   S/P TURP 10/13/2013   Bladder outlet obstruction 10/13/2013   Past Medical History:  Diagnosis Date   Anxiety    Arthritis    Osteoarthritis-left hip, s/p arthroplasty knees, right hip   Cancer (HCC)    basal and squamous cell cancer lesion   Cancer (HCC) 06/04/2000   prostate   Depression    GERD (gastroesophageal reflux disease)    Gout    History of kidney stones    x1 passed on own   Hypertension    Neuromuscular disorder (HCC)     restless leg syndrome   Sleep apnea    CPAP    Past Surgical History:  Procedure Laterality Date   4 titanium posts for denture placement     APPENDECTOMY     UNKNOWN   BUNIONECTOMY Bilateral 08/02/2012   CATARACT EXTRACTION Left 2003   CATARACT EXTRACTION W/PHACO Right 08/23/2015   Procedure: CATARACT EXTRACTION PHACO AND INTRAOCULAR LENS PLACEMENT (IOC);  Surgeon: Clair Crews, MD;  Location: [facility] OR[facility];  Service: Ophthalmology;  Laterality: Right;  US  1:00 AP% 35.2 CDE 10.11 fluid pack lot # [id number]   CHOLECYSTECTOMY     "stones"- Laparoscopic   ERCP N/A 02/18/2013   Procedure: ENDOSCOPIC RETROGRADE CHOLANGIOPANCREATOGRAPHY (ERCP);  Surgeon: Evangeline Hilts, MD;  Location: [facility];  Service: Endoscopy;  Laterality: N/A;   EUS N/A 02/18/2013   Procedure: ESOPHAGEAL ENDOSCOPIC ULTRASOUND (EUS) RADIAL;  Surgeon: Evangeline Hilts, MD;  Location: [facility];  Service: Endoscopy;  Laterality: N/A;   EYE SURGERY     HAMMER TOE SURGERY Left    and bunionectomy   HAND ARTHROTOMY Right    thumb surgery   HIP ARTHROPLASTY Right    HIP CLOSED REDUCTION Right 01/24/2022   Procedure: CLOSED MANIPULATION HIP;  Surgeon: Donnamarie Gables, MD;  Location: [facility];  Service: Orthopedics;  Laterality: Right;   HIP CLOSED REDUCTION Right 08/12/2023   Procedure: CLOSED REDUCTION, HIP;  Surgeon: Sammye Cristal, MD;  Location: [facility];  Service: Orthopedics;  Laterality: Right;   INGUINAL HERNIA REPAIR N/A 01/31/2017   Procedure: LAPAROSCOPIC EXPLORATION  AND REPAIR OF BILATERAL INGUINAL HERNIAS;  Surgeon: Candyce Champagne, MD;  Location: [facility];  Service: General;  Laterality: N/A;  With MESH   JOINT REPLACEMENT     KNEE ARTHROPLASTY Bilateral 2000   LAPAROSCOPIC APPENDECTOMY N/A 11/03/2013   Procedure: APPENDECTOMY LAPAROSCOPIC/ PRIMARY INCISIONAL HERNIA REPAIR;  Surgeon: Enid Harry, MD;  Location: [facility];  Service: General;  Laterality: N/A;   TRANSURETHRAL RESECTION OF PROSTATE       No current facility-administered medications for this encounter.   Current Outpatient Medications  Medication Sig Dispense Refill Last Dose/Taking   acetaminophen  (TYLENOL ) 650 MG CR tablet Take 650-1,300 mg by mouth every 8 (eight) hours as needed for pain.   Taking As Needed   allopurinol (ZYLOPRIM) 100 MG tablet Take 100 mg by mouth in the morning.   Taking   ALPRAZolam (XANAX) 0.5 MG tablet Take 0.5 mg by mouth at bedtime.   Taking   amLODipine  (NORVASC ) 10 MG tablet Take 10 mg by mouth in the morning.   Taking   aspirin  EC 81 MG tablet Take 81 mg by mouth at bedtime.    Taking   ipratropium (ATROVENT) 0.06 % nasal spray Place 2 sprays into both nostrils in the morning.   Taking   Multiple Vitamin (MULTIVITAMIN WITH MINERALS) TABS tablet Take 1 tablet by mouth daily with lunch.   Taking   Omega-3 Fatty Acids (FISH OIL PO) Take 1 capsule by mouth daily with lunch.   Taking   pantoprazole  (PROTONIX ) 40 MG tablet Take 40 mg by mouth daily before breakfast.   Taking   Polyethyl Glycol-Propyl Glycol (SYSTANE) 0.4-0.3 % SOLN Place 1-2 drops into both eyes 3 (three) times daily as needed (dry/irritated eyes.).   Taking As Needed   sertraline  (ZOLOFT ) 100 MG tablet Take 100 mg by mouth in the morning.   Taking   Allergies  Allergen Reactions   Arthrotec [Diclofenac-Misoprostol] Diarrhea    Social History   Tobacco Use   Smoking status: Former    Current packs/day: 0.00    Average packs/day: 1 pack/day for 12.0 years (12.0 ttl pk-yrs)    Types: Cigarettes    Start date: 06/04/1970    Quit date: 06/04/1982    Years since quitting: 41.3   Smokeless tobacco: Never   Tobacco comments:    no passive smokers in home  Substance Use Topics   Alcohol  use: Yes    Alcohol /week: 1.0 standard drink of alcohol     Types: 1 Cans of beer per week    Comment: rare     Family History  Problem Relation Age of Onset   Diabetes Mellitus II Neg Hx    CAD Neg Hx       Review of Systems   Constitutional: Negative.   HENT: Negative.    Eyes: Negative.   Respiratory: Negative.    Cardiovascular: Negative.   Gastrointestinal: Negative.   Endocrine: Negative.   Genitourinary:  Positive for frequency.       Enlarged prostate, ED  Musculoskeletal:  Positive for arthralgias and myalgias.  Skin: Negative.   Allergic/Immunologic: Negative.   Neurological: Negative.   Hematological:  Bruises/bleeds easily.  Psychiatric/Behavioral: Negative.      Objective:  Physical Exam Constitutional:      Appearance: Normal appearance. He is normal weight.  HENT:     Head: Normocephalic and atraumatic.     Nose: Nose normal.  Eyes:     Pupils: Pupils are equal, round, and reactive to light.  Cardiovascular:  Pulses: Normal pulses.  Pulmonary:     Effort: Pulmonary effort is normal.  Musculoskeletal:     Cervical back: Normal range of motion and neck supple.     Comments: His hip wound is well-healed no irritability to internal or external rotation foot tap is negative no pain with resisted flexion abduction and abduction or extension.  Skin:    General: Skin is warm and dry.  Neurological:     General: No focal deficit present.     Mental Status: He is alert and oriented to person, place, and time. Mental status is at baseline.  Psychiatric:        Mood and Affect: Mood normal.        Behavior: Behavior normal.        Thought Content: Thought content normal.        Judgment: Judgment normal.     Vital signs in last 24 hours:     Labs:   Estimated body mass index is 28.07 kg/m as calculated from the following:   Height as of 10/02/23: 6' (1.829 m).   Weight as of 10/02/23: 93.9 kg.  Imaging Review:  Plain radiographs demonstrate  AP pelvis and crosstable lateral shows no change in the position of the implants which appear to be well ingrown.    Assessment/Plan:  End stage arthritis, right hip(s) with failed previous arthroplasty.  The patient history,  physical examination, clinical judgement of the provider and imaging studies are consistent with end stage degenerative joint disease of the right hip(s), previous total hip arthroplasty. Revision total hip arthroplasty is deemed medically necessary. The treatment options including medical management, injection therapy, arthroscopy and arthroplasty were discussed at length. The risks and benefits of total hip arthroplasty were presented and reviewed. The risks due to aseptic loosening, infection, stiffness, dislocation/subluxation,  thromboembolic complications and other imponderables were discussed.  The patient acknowledged the explanation, agreed to proceed with the plan and consent was signed. Patient is being admitted for inpatient treatment for surgery, pain control, PT, OT, prophylactic antibiotics, VTE prophylaxis, progressive ambulation and ADL's and discharge planning. The patient is planning to be discharged home with home health services

## 2023-10-13 MED ORDER — TRANEXAMIC ACID 1000 MG/10ML IV SOLN
2000.0000 mg | INTRAVENOUS | Status: DC
  Filled 2023-10-13: qty 20

## 2023-10-14 ENCOUNTER — Inpatient Hospital Stay (HOSPITAL_COMMUNITY)

## 2023-10-14 ENCOUNTER — Other Ambulatory Visit: Payer: Self-pay

## 2023-10-14 ENCOUNTER — Inpatient Hospital Stay (HOSPITAL_COMMUNITY): Admitting: Anesthesiology

## 2023-10-14 ENCOUNTER — Encounter (HOSPITAL_COMMUNITY): Payer: Self-pay | Admitting: Orthopedic Surgery

## 2023-10-14 ENCOUNTER — Inpatient Hospital Stay (HOSPITAL_COMMUNITY)
Admission: RE | Admit: 2023-10-14 | Discharge: 2023-10-15 | DRG: 468 | Disposition: A | Discharge status: Home Health | Attending: Orthopedic Surgery | Admitting: Orthopedic Surgery

## 2023-10-14 ENCOUNTER — Encounter (HOSPITAL_COMMUNITY)
Admission: RE | Disposition: A | Discharge status: Home Health | Payer: Self-pay | Source: Home / Self Care | Attending: Orthopedic Surgery

## 2023-10-14 DIAGNOSIS — Z7982 Long term (current) use of aspirin: Secondary | ICD-10-CM | POA: Diagnosis not present

## 2023-10-14 DIAGNOSIS — M24451 Recurrent dislocation, right hip: Secondary | ICD-10-CM | POA: Diagnosis not present

## 2023-10-14 DIAGNOSIS — Z96653 Presence of artificial knee joint, bilateral: Secondary | ICD-10-CM | POA: Diagnosis present

## 2023-10-14 DIAGNOSIS — Z85828 Personal history of other malignant neoplasm of skin: Secondary | ICD-10-CM | POA: Diagnosis not present

## 2023-10-14 DIAGNOSIS — Z8546 Personal history of malignant neoplasm of prostate: Secondary | ICD-10-CM | POA: Diagnosis not present

## 2023-10-14 DIAGNOSIS — Z87891 Personal history of nicotine dependence: Secondary | ICD-10-CM

## 2023-10-14 DIAGNOSIS — G4733 Obstructive sleep apnea (adult) (pediatric): Secondary | ICD-10-CM | POA: Diagnosis not present

## 2023-10-14 DIAGNOSIS — Z888 Allergy status to other drugs, medicaments and biological substances status: Secondary | ICD-10-CM | POA: Diagnosis not present

## 2023-10-14 DIAGNOSIS — T84029A Dislocation of unspecified internal joint prosthesis, initial encounter: Secondary | ICD-10-CM

## 2023-10-14 DIAGNOSIS — Z79899 Other long term (current) drug therapy: Secondary | ICD-10-CM

## 2023-10-14 DIAGNOSIS — Z9181 History of falling: Secondary | ICD-10-CM | POA: Diagnosis not present

## 2023-10-14 DIAGNOSIS — G2581 Restless legs syndrome: Secondary | ICD-10-CM | POA: Diagnosis not present

## 2023-10-14 DIAGNOSIS — T84020A Dislocation of internal right hip prosthesis, initial encounter: Secondary | ICD-10-CM | POA: Diagnosis not present

## 2023-10-14 DIAGNOSIS — K219 Gastro-esophageal reflux disease without esophagitis: Secondary | ICD-10-CM | POA: Diagnosis present

## 2023-10-14 DIAGNOSIS — G4730 Sleep apnea, unspecified: Secondary | ICD-10-CM | POA: Diagnosis not present

## 2023-10-14 DIAGNOSIS — Z9079 Acquired absence of other genital organ(s): Secondary | ICD-10-CM | POA: Diagnosis not present

## 2023-10-14 DIAGNOSIS — Y792 Prosthetic and other implants, materials and accessory orthopedic devices associated with adverse incidents: Secondary | ICD-10-CM | POA: Diagnosis not present

## 2023-10-14 DIAGNOSIS — F418 Other specified anxiety disorders: Secondary | ICD-10-CM | POA: Diagnosis not present

## 2023-10-14 DIAGNOSIS — Z96649 Presence of unspecified artificial hip joint: Secondary | ICD-10-CM

## 2023-10-14 DIAGNOSIS — Z9049 Acquired absence of other specified parts of digestive tract: Secondary | ICD-10-CM | POA: Diagnosis not present

## 2023-10-14 DIAGNOSIS — I10 Essential (primary) hypertension: Secondary | ICD-10-CM

## 2023-10-14 DIAGNOSIS — Z87442 Personal history of urinary calculi: Secondary | ICD-10-CM

## 2023-10-14 DIAGNOSIS — Z96641 Presence of right artificial hip joint: Secondary | ICD-10-CM | POA: Diagnosis not present

## 2023-10-14 DIAGNOSIS — T84020D Dislocation of internal right hip prosthesis, subsequent encounter: Secondary | ICD-10-CM | POA: Diagnosis not present

## 2023-10-14 SURGERY — TOTAL HIP REVISION
Anesthesia: Monitor Anesthesia Care | Site: Hip | Laterality: Right

## 2023-10-14 MED ORDER — PROPOFOL 1000 MG/100ML IV EMUL
INTRAVENOUS | Status: AC
  Filled 2023-10-14: qty 100

## 2023-10-14 MED ORDER — DEXAMETHASONE SODIUM PHOSPHATE 10 MG/ML IJ SOLN
10.0000 mg | Freq: Once | INTRAMUSCULAR | Status: AC
  Administered 2023-10-15: 10 mg via INTRAVENOUS
  Filled 2023-10-14: qty 1

## 2023-10-14 MED ORDER — ORAL CARE MOUTH RINSE: 15.0000 mL | Freq: Once | OROMUCOSAL | Status: AC

## 2023-10-14 MED ORDER — DEXAMETHASONE SODIUM PHOSPHATE 10 MG/ML IJ SOLN
INTRAMUSCULAR | Status: AC
  Filled 2023-10-14: qty 1

## 2023-10-14 MED ORDER — ONDANSETRON HCL 4 MG/2ML IJ SOLN
4.0000 mg | Freq: Four times a day (QID) | INTRAMUSCULAR | Status: DC | PRN
Start: 2023-10-14 — End: 2023-10-15

## 2023-10-14 MED ORDER — METHOCARBAMOL 500 MG PO TABS: 500.0000 mg | ORAL_TABLET | Freq: Four times a day (QID) | ORAL | Status: DC | PRN

## 2023-10-14 MED ORDER — TRANEXAMIC ACID 1000 MG/10ML IV SOLN
INTRAVENOUS | Status: DC | PRN
  Administered 2023-10-14: 2000 mg via TOPICAL

## 2023-10-14 MED ORDER — BISACODYL 5 MG PO TBEC: 5.0000 mg | DELAYED_RELEASE_TABLET | Freq: Every day | ORAL | Status: DC | PRN

## 2023-10-14 MED ORDER — PROPOFOL 500 MG/50ML IV EMUL
INTRAVENOUS | Status: DC | PRN
Start: 2023-10-14 — End: 2023-10-14
  Administered 2023-10-14: 10 mg via INTRAVENOUS
  Administered 2023-10-14: 140 ug/kg/min via INTRAVENOUS

## 2023-10-14 MED ORDER — TIZANIDINE HCL 2 MG PO TABS: 2.0000 mg | ORAL_TABLET | Freq: Four times a day (QID) | ORAL | 0 refills | Status: AC | PRN

## 2023-10-14 MED ORDER — ONDANSETRON HCL 4 MG/2ML IJ SOLN: 4.0000 mg | Freq: Once | INTRAMUSCULAR | Status: DC | PRN

## 2023-10-14 MED ORDER — OXYCODONE HCL 5 MG PO TABS: 5.0000 mg | ORAL_TABLET | ORAL | Status: DC | PRN

## 2023-10-14 MED ORDER — SODIUM CHLORIDE (PF) 0.9 % IJ SOLN
INTRAMUSCULAR | Status: AC
  Filled 2023-10-14: qty 50

## 2023-10-14 MED ORDER — BUPIVACAINE-EPINEPHRINE (PF) 0.5% -1:200000 IJ SOLN
INTRAMUSCULAR | Status: AC
  Filled 2023-10-14: qty 30

## 2023-10-14 MED ORDER — EPHEDRINE SULFATE-NACL 50-0.9 MG/10ML-% IV SOSY
PREFILLED_SYRINGE | INTRAVENOUS | Status: DC | PRN
  Administered 2023-10-14 (×2): 10 mg via INTRAVENOUS

## 2023-10-14 MED ORDER — ALPRAZOLAM 0.5 MG PO TABS
0.5000 mg | ORAL_TABLET | Freq: Every day | ORAL | Status: DC
  Administered 2023-10-14: 0.5 mg via ORAL
  Filled 2023-10-14: qty 1

## 2023-10-14 MED ORDER — CHLORHEXIDINE GLUCONATE 0.12 % MT SOLN
15.0000 mL | Freq: Once | OROMUCOSAL | Status: AC
  Administered 2023-10-14: 15 mL via OROMUCOSAL

## 2023-10-14 MED ORDER — ALLOPURINOL 100 MG PO TABS
100.0000 mg | ORAL_TABLET | Freq: Every day | ORAL | Status: DC
  Administered 2023-10-15: 100 mg via ORAL
  Filled 2023-10-14: qty 1

## 2023-10-14 MED ORDER — CEFAZOLIN SODIUM-DEXTROSE 2-4 GM/100ML-% IV SOLN
2.0000 g | INTRAVENOUS | Status: AC
Start: 2023-10-14 — End: 2023-10-14
  Administered 2023-10-14: 2 g via INTRAVENOUS
  Filled 2023-10-14: qty 100

## 2023-10-14 MED ORDER — ACETAMINOPHEN 500 MG PO TABS
1000.0000 mg | ORAL_TABLET | Freq: Once | ORAL | Status: AC
Start: 2023-10-14 — End: 2023-10-14
  Administered 2023-10-14: 1000 mg via ORAL
  Filled 2023-10-14: qty 2

## 2023-10-14 MED ORDER — TRAMADOL HCL 50 MG PO TABS
50.0000 mg | ORAL_TABLET | Freq: Four times a day (QID) | ORAL | Status: DC
  Administered 2023-10-14 – 2023-10-15 (×5): 50 mg via ORAL
  Filled 2023-10-14 (×5): qty 1

## 2023-10-14 MED ORDER — POLYETHYLENE GLYCOL 3350 17 G PO PACK: 17.0000 g | PACK | Freq: Every day | ORAL | Status: DC | PRN

## 2023-10-14 MED ORDER — ONDANSETRON HCL 4 MG/2ML IJ SOLN
INTRAMUSCULAR | Status: DC | PRN
  Administered 2023-10-14: 4 mg via INTRAVENOUS

## 2023-10-14 MED ORDER — ONDANSETRON HCL 4 MG PO TABS: 4.0000 mg | ORAL_TABLET | Freq: Four times a day (QID) | ORAL | Status: DC | PRN

## 2023-10-14 MED ORDER — LACTATED RINGERS IV BOLUS: 500.0000 mL | Freq: Once | INTRAVENOUS | Status: DC

## 2023-10-14 MED ORDER — PHENYLEPHRINE HCL-NACL 20-0.9 MG/250ML-% IV SOLN
INTRAVENOUS | Status: DC | PRN
  Administered 2023-10-14: 20 ug/min via INTRAVENOUS

## 2023-10-14 MED ORDER — POVIDONE-IODINE 10 % EX SWAB
2.0000 | Freq: Once | CUTANEOUS | Status: AC
  Administered 2023-10-14: 2 via TOPICAL

## 2023-10-14 MED ORDER — AMLODIPINE BESYLATE 10 MG PO TABS
10.0000 mg | ORAL_TABLET | Freq: Every day | ORAL | Status: DC
  Administered 2023-10-15: 10 mg via ORAL
  Filled 2023-10-14: qty 1

## 2023-10-14 MED ORDER — PHENYLEPHRINE HCL-NACL 20-0.9 MG/250ML-% IV SOLN
INTRAVENOUS | Status: AC
Start: 2023-10-14 — End: 2023-10-14
  Filled 2023-10-14: qty 250

## 2023-10-14 MED ORDER — ONDANSETRON HCL 4 MG/2ML IJ SOLN
INTRAMUSCULAR | Status: AC
  Filled 2023-10-14: qty 2

## 2023-10-14 MED ORDER — SERTRALINE HCL 100 MG PO TABS
100.0000 mg | ORAL_TABLET | Freq: Every day | ORAL | Status: DC
  Administered 2023-10-15: 100 mg via ORAL
  Filled 2023-10-14: qty 1

## 2023-10-14 MED ORDER — 0.9 % SODIUM CHLORIDE (POUR BTL) OPTIME
TOPICAL | Status: DC | PRN
  Administered 2023-10-14: 1000 mL

## 2023-10-14 MED ORDER — AMISULPRIDE (ANTIEMETIC) 5 MG/2ML IV SOLN: 10.0000 mg | Freq: Once | INTRAVENOUS | Status: DC | PRN

## 2023-10-14 MED ORDER — LACTATED RINGERS IV SOLN: INTRAVENOUS | Status: DC

## 2023-10-14 MED ORDER — DOCUSATE SODIUM 100 MG PO CAPS
100.0000 mg | ORAL_CAPSULE | Freq: Two times a day (BID) | ORAL | Status: DC
  Administered 2023-10-14 – 2023-10-15 (×2): 100 mg via ORAL
  Filled 2023-10-14 (×2): qty 1

## 2023-10-14 MED ORDER — FLEET ENEMA RE ENEM: 1.0000 | ENEMA | Freq: Once | RECTAL | Status: DC | PRN

## 2023-10-14 MED ORDER — BUPIVACAINE IN DEXTROSE 0.75-8.25 % IT SOLN
INTRATHECAL | Status: DC | PRN
  Administered 2023-10-14: 1.6 mL via INTRATHECAL

## 2023-10-14 MED ORDER — OXYCODONE-ACETAMINOPHEN 5-325 MG PO TABS: 1.0000 | ORAL_TABLET | ORAL | 0 refills | Status: AC | PRN

## 2023-10-14 MED ORDER — BUPIVACAINE LIPOSOME 1.3 % IJ SUSP
INTRAMUSCULAR | Status: AC
  Filled 2023-10-14: qty 10

## 2023-10-14 MED ORDER — METHOCARBAMOL 1000 MG/10ML IJ SOLN: 500.0000 mg | Freq: Four times a day (QID) | INTRAMUSCULAR | Status: DC | PRN

## 2023-10-14 MED ORDER — BUPIVACAINE LIPOSOME 1.3 % IJ SUSP: 10.0000 mL | Freq: Once | INTRAMUSCULAR | Status: DC

## 2023-10-14 MED ORDER — ACETAMINOPHEN 500 MG PO TABS
1000.0000 mg | ORAL_TABLET | Freq: Four times a day (QID) | ORAL | Status: AC
  Administered 2023-10-14 – 2023-10-15 (×3): 1000 mg via ORAL
  Filled 2023-10-14 (×3): qty 2

## 2023-10-14 MED ORDER — METOCLOPRAMIDE HCL 5 MG/ML IJ SOLN: 5.0000 mg | Freq: Three times a day (TID) | INTRAMUSCULAR | Status: DC | PRN

## 2023-10-14 MED ORDER — OXYCODONE HCL 5 MG/5ML PO SOLN: 5.0000 mg | Freq: Once | ORAL | Status: DC | PRN

## 2023-10-14 MED ORDER — ALUM & MAG HYDROXIDE-SIMETH 200-200-20 MG/5ML PO SUSP: 30.0000 mL | ORAL | Status: DC | PRN

## 2023-10-14 MED ORDER — DIPHENHYDRAMINE HCL 12.5 MG/5ML PO ELIX: 12.5000 mg | ORAL_SOLUTION | ORAL | Status: DC | PRN

## 2023-10-14 MED ORDER — HYDROMORPHONE HCL 1 MG/ML IJ SOLN: 0.5000 mg | INTRAMUSCULAR | Status: DC | PRN

## 2023-10-14 MED ORDER — TRANEXAMIC ACID-NACL 1000-0.7 MG/100ML-% IV SOLN
1000.0000 mg | Freq: Once | INTRAVENOUS | Status: AC
  Administered 2023-10-14: 1000 mg via INTRAVENOUS
  Filled 2023-10-14: qty 100

## 2023-10-14 MED ORDER — PHENOL 1.4 % MT LIQD: 1.0000 | OROMUCOSAL | Status: DC | PRN

## 2023-10-14 MED ORDER — OXYCODONE HCL 5 MG PO TABS
10.0000 mg | ORAL_TABLET | ORAL | Status: DC | PRN
Start: 2023-10-14 — End: 2023-10-15

## 2023-10-14 MED ORDER — MIDAZOLAM HCL 2 MG/2ML IJ SOLN
INTRAMUSCULAR | Status: AC
  Filled 2023-10-14: qty 2

## 2023-10-14 MED ORDER — KCL IN DEXTROSE-NACL 20-5-0.45 MEQ/L-%-% IV SOLN
INTRAVENOUS | Status: AC
  Filled 2023-10-14 (×3): qty 1000

## 2023-10-14 MED ORDER — TRANEXAMIC ACID-NACL 1000-0.7 MG/100ML-% IV SOLN
1000.0000 mg | INTRAVENOUS | Status: AC
  Administered 2023-10-14: 1000 mg via INTRAVENOUS
  Filled 2023-10-14: qty 100

## 2023-10-14 MED ORDER — ACETAMINOPHEN 325 MG PO TABS: 325.0000 mg | ORAL_TABLET | Freq: Four times a day (QID) | ORAL | Status: DC | PRN

## 2023-10-14 MED ORDER — PANTOPRAZOLE SODIUM 40 MG PO TBEC
40.0000 mg | DELAYED_RELEASE_TABLET | Freq: Every day | ORAL | Status: DC
  Administered 2023-10-15: 40 mg via ORAL
  Filled 2023-10-14: qty 1

## 2023-10-14 MED ORDER — HYDROMORPHONE HCL 1 MG/ML IJ SOLN: 0.2500 mg | INTRAMUSCULAR | Status: DC | PRN

## 2023-10-14 MED ORDER — ASPIRIN 81 MG PO TBEC: 81.0000 mg | DELAYED_RELEASE_TABLET | Freq: Two times a day (BID) | ORAL | 0 refills | Status: AC

## 2023-10-14 MED ORDER — METOCLOPRAMIDE HCL 5 MG PO TABS: 5.0000 mg | ORAL_TABLET | Freq: Three times a day (TID) | ORAL | Status: DC | PRN

## 2023-10-14 MED ORDER — OXYCODONE HCL 5 MG PO TABS: 5.0000 mg | ORAL_TABLET | Freq: Once | ORAL | Status: DC | PRN

## 2023-10-14 MED ORDER — POLYVINYL ALCOHOL 1.4 % OP SOLN: 1.0000 [drp] | Freq: Three times a day (TID) | OPHTHALMIC | Status: DC | PRN

## 2023-10-14 MED ORDER — LACTATED RINGERS IV BOLUS
250.0000 mL | Freq: Once | INTRAVENOUS | Status: AC
  Administered 2023-10-14: 250 mL via INTRAVENOUS

## 2023-10-14 MED ORDER — DEXAMETHASONE SODIUM PHOSPHATE 10 MG/ML IJ SOLN
INTRAMUSCULAR | Status: DC | PRN
  Administered 2023-10-14: 10 mg via INTRAVENOUS

## 2023-10-14 MED ORDER — ASPIRIN 81 MG PO CHEW
81.0000 mg | CHEWABLE_TABLET | Freq: Two times a day (BID) | ORAL | Status: DC
  Administered 2023-10-14 – 2023-10-15 (×2): 81 mg via ORAL
  Filled 2023-10-14 (×2): qty 1

## 2023-10-14 MED ORDER — BUPIVACAINE-EPINEPHRINE (PF) 0.5% -1:200000 IJ SOLN
INTRAMUSCULAR | Status: DC | PRN
  Administered 2023-10-14: 40 mL

## 2023-10-14 MED ORDER — MENTHOL 3 MG MT LOZG: 1.0000 | LOZENGE | OROMUCOSAL | Status: DC | PRN

## 2023-10-14 MED ORDER — IPRATROPIUM BROMIDE 0.06 % NA SOLN
2.0000 | Freq: Every day | NASAL | Status: DC
  Administered 2023-10-15: 2 via NASAL
  Filled 2023-10-14: qty 15

## 2023-10-14 SURGICAL SUPPLY — 52 items
BAG COUNTER SPONGE SURGICOUNT (BAG) IMPLANT
BAG DECANTER FOR FLEXI CONT (MISCELLANEOUS) ×1 IMPLANT
BALL HIP PLUS 3MM (Hips) IMPLANT
BLADE SAW SGTL 73X25 THK (BLADE) IMPLANT
CHLORAPREP W/TINT 26 (MISCELLANEOUS) ×1 IMPLANT
COVER SURGICAL LIGHT HANDLE (MISCELLANEOUS) ×1 IMPLANT
DRAPE C-ARM 42X120 X-RAY (DRAPES) IMPLANT
DRAPE C-ARMOR (DRAPES) IMPLANT
DRAPE SHEET LG 3/4 BI-LAMINATE (DRAPES) ×1 IMPLANT
DRAPE SURG ORHT 6 SPLT 77X108 (DRAPES) ×2 IMPLANT
DRAPE U-SHAPE 47X51 STRL (DRAPES) ×1 IMPLANT
DRSG AQUACEL AG ADV 3.5X10 (GAUZE/BANDAGES/DRESSINGS) ×1 IMPLANT
ELECT BLADE TIP CTD 4 INCH (ELECTRODE) ×1 IMPLANT
ELECT REM PT RETURN 15FT ADLT (MISCELLANEOUS) ×1 IMPLANT
GAUZE SPONGE 4X4 12PLY STRL (GAUZE/BANDAGES/DRESSINGS) IMPLANT
GAUZE XEROFORM 5X9 LF (GAUZE/BANDAGES/DRESSINGS) IMPLANT
GLOVE BIO SURGEON STRL SZ7.5 (GLOVE) ×1 IMPLANT
GLOVE BIO SURGEON STRL SZ8.5 (GLOVE) ×1 IMPLANT
GLOVE BIOGEL PI IND STRL 8 (GLOVE) ×1 IMPLANT
GLOVE BIOGEL PI IND STRL 9 (GLOVE) ×1 IMPLANT
GOWN STRL SURGICAL XL XLNG (GOWN DISPOSABLE) ×2 IMPLANT
HOLDER FOLEY CATH W/STRAP (MISCELLANEOUS) ×1 IMPLANT
HOOD PEEL AWAY T7 (MISCELLANEOUS) ×4 IMPLANT
IMMOBILIZER KNEE 20 (SOFTGOODS) IMPLANT
IMMOBILIZER KNEE 20 THIGH 36 (SOFTGOODS) IMPLANT
INSERT DM PINNACLE 56X49 (Insert) IMPLANT
IV NS IRRIG 3000ML ARTHROMATIC (IV SOLUTION) ×1 IMPLANT
KIT BASIN OR (CUSTOM PROCEDURE TRAY) ×1 IMPLANT
KIT TURNOVER KIT A (KITS) IMPLANT
LINER BI-MENTUM ALTRX 49X28 (Liner) IMPLANT
NDL HYPO 21X1.5 SAFETY (NEEDLE) ×1 IMPLANT
NDL MAYO CATGUT SZ4 TPR NDL (NEEDLE) IMPLANT
NDL SAFETY ECLIPSE 18X1.5 (NEEDLE) ×1 IMPLANT
NEEDLE HYPO 21X1.5 SAFETY (NEEDLE) ×1 IMPLANT
NEEDLE MAYO CATGUT SZ4 (NEEDLE) IMPLANT
NS IRRIG 1000ML POUR BTL (IV SOLUTION) ×1 IMPLANT
PACK TOTAL JOINT (CUSTOM PROCEDURE TRAY) ×1 IMPLANT
PASSER SUT SWANSON 36MM LOOP (INSTRUMENTS) IMPLANT
PROTECTOR NERVE ULNAR (MISCELLANEOUS) ×1 IMPLANT
SPIKE FLUID TRANSFER (MISCELLANEOUS) ×2 IMPLANT
SUT ETHIBOND 2 V 37 (SUTURE) ×3 IMPLANT
SUT VIC AB 0 CT1 36 (SUTURE) ×2 IMPLANT
SUT VIC AB 1 CTX36XBRD ANBCTR (SUTURE) ×1 IMPLANT
SUT VICRYL+ 3-0 36IN CT-1 (SUTURE) ×1 IMPLANT
SWAB COLLECTION DEVICE MRSA (MISCELLANEOUS) IMPLANT
SWAB CULTURE ESWAB REG 1ML (MISCELLANEOUS) IMPLANT
SYR CONTROL 10ML LL (SYRINGE) ×2 IMPLANT
TOWEL OR 17X26 10 PK STRL BLUE (TOWEL DISPOSABLE) ×1 IMPLANT
TOWER CARTRIDGE SMART MIX (DISPOSABLE) IMPLANT
TRAY FOLEY MTR SLVR 14FR STAT (SET/KITS/TRAYS/PACK) ×1 IMPLANT
TUBE SUCTION HIGH CAP CLEAR NV (SUCTIONS) ×1 IMPLANT
WATER STERILE IRR 1000ML POUR (IV SOLUTION) ×2 IMPLANT

## 2023-10-14 NOTE — Progress Notes (Signed)
   10/14/23 2148  BiPAP/CPAP/SIPAP  Reason BIPAP/CPAP not in use Non-compliant   Patient stated he is non complaint at home and knows he should wear but does not  want wear while in the hospital. He was educated on the hospital use and when to ask to wear if he wishes to during his stay.

## 2023-10-14 NOTE — Anesthesia Preprocedure Evaluation (Addendum)
 Anesthesia Evaluation  Patient identified by MRN, date of birth, ID band Patient awake    Reviewed: Allergy & Precautions, NPO status , Patient's Chart, lab work & pertinent test results  Airway Mallampati: III  TM Distance: >3 FB Neck ROM: Full    Dental  (+) Edentulous Upper, Lower Dentures Lower dentures unable to be removed:   Pulmonary sleep apnea and Continuous Positive Airway Pressure Ventilation , former smoker Quit smoking 1984, 12 pack year history    Pulmonary exam normal breath sounds clear to auscultation       Cardiovascular hypertension (137/84 preop), Normal cardiovascular exam Rhythm:Regular Rate:Normal     Neuro/Psych  PSYCHIATRIC DISORDERS Anxiety Depression    negative neurological ROS     GI/Hepatic Neg liver ROS,GERD  Medicated and Controlled,,  Endo/Other  negative endocrine ROS    Renal/GU negative Renal ROS  negative genitourinary   Musculoskeletal  (+) Arthritis , Osteoarthritis,    Abdominal   Peds  Hematology negative hematology ROS (+)   Anesthesia Other Findings   Reproductive/Obstetrics negative OB ROS                             Anesthesia Physical Anesthesia Plan  ASA: 3  Anesthesia Plan: Spinal and MAC   Post-op Pain Management: Tylenol  PO (pre-op)*   Induction:   PONV Risk Score and Plan: 2 and Propofol  infusion and TIVA  Airway Management Planned: Natural Airway and Nasal Cannula  Additional Equipment: None  Intra-op Plan:   Post-operative Plan:   Informed Consent: I have reviewed the patients History and Physical, chart, labs and discussed the procedure including the risks, benefits and alternatives for the proposed anesthesia with the patient or authorized representative who has indicated his/her understanding and acceptance.       Plan Discussed with: CRNA  Anesthesia Plan Comments:        Anesthesia Quick Evaluation

## 2023-10-14 NOTE — Op Note (Signed)
 Preop diagnosis: Recurrent posterior dislocations right total hip  Postoperative diagnosis: Same  Procedure: Revision acetabular component right total hip with removal of a +4 liner from a 56 mm DePuy Pinnacle shell placement of a 56 mm dual mobility liner a +3 x 28 mm metal head and a 49 mm dual mobility ball.  Surgeon: Elgie Grist. Carry Clapper M.D.  Assistant: Bunny Caroli. Horacio Lyell  (present throughout entire procedure and necessary for timely completion of the procedure)  Estimated blood loss: 300 cc cc  Fluid replacement: 800 cc cc of crystalloid  Complications: None  Specimens: none  Indications: 83 year old man who underwent a right total hip arthroplasty using a DePuy Pinnacle shell and S-ROM stem by me in the year 2010.  He did well until last year when he had his first posterior dislocation getting out of the shower.  He dislocated again in March and both times underwent successful closed reduction.  Geometric Lee his acetabular component is in 45 degrees of abduction and 20 degrees of anteversion.  His leg lengths are equal.  In order to prevent further dislocations we have elected to place a dual mobility bearing system in the hopes will prevent further dislocations of his diametrically stable hip.  The risks and benefits of surgery been discussed and all questions answered.  Procedure: Patient was identified by arm band receive preoperative IV antibiotics in the pre-op holding area. Patient was taken to the operating room where the appropriate anesthetic monitors were attached and spinal anesthesia induced. Patient was positioned into the left lateral decubitus position and fixed there with a mark 2 pelvic clamp. The limb was prepped and draped in usual sterile fashion from the ankle to the hemipelvis. Time out procedure performed. Skin along the lateral hip and thigh infiltrated with 10 cc of 1/2% Marcaine  and epinephrine  solution. We began the operation by recreating the old posterior lateral  incision 15 cm hrough the skin and subcutaneous tissue down to the level of the IT band which was cut in line with the skin incision. This exposed the greater trochanter.  Posterior pseudocapsule was dissected off of the greater trochanter, exposing the trunnion and ball of the hip replacement.  Synovial fluid was clear.  The hip was flexed and internally rotated dislocating the femoral head which was then removed with a metal cylinder and a mallet.  We dissected around the acetabular component were able to tucked the trunnion up anterior and superior to the Pinnacle cup.  This allowed removal of the polyethylene liner which did have where posteriorly where the dislocations had occurred.  It should be noted we had to flex him to 90 degrees of internal rotation with 10 degrees of adduction to dislocate the hip and it was actually rather difficult to dislocate the hip.  It could not be dislocated anteriorly.  The locking mechanism of the Pinnacle shell was noted to be intact.  At this point we implanted a dual mobility liner to fit inside of a 56 mm DePuy Pinnacle shell.  We carefully checked around the rim to make sure that it was locked in completely.  We then performed trials with a +0 and a +3 28 mm head inside of a 49 mm ball and felt there was some enhanced ability with the +3 neck although the hip was very difficult to reduce.  The trial components were removed and a real +3 metal head was pressed into a 49 mm ball and then hammered onto the trunnion of the S-ROM stem.  The hip was again reduced stability checked and found to be excellent.  Once again the wound was irrigated out with normal saline solution.  A mixture of 1 vial of Exparel  and 1 vial of Marcaine  was injected into the periarticular soft tissues using 10 cc syringes.  The IT band was closed with running #1 Vicryl suture in 2 layers.  Subcutaneous tissue and subcuticular closures were accomplished with 3-0 Vicryl suture.  An Aquacel dressing was  applied.  Patient was unclamped placed supine and taken to the recovery room without difficulty.

## 2023-10-14 NOTE — OR Nursing (Signed)
 Components explanted from right hip by Carry Clapper, MD

## 2023-10-14 NOTE — Anesthesia Procedure Notes (Signed)
 Spinal  Patient location during procedure: OR Start time: 10/14/2023 7:17 AM End time: 10/14/2023 7:19 AM Reason for block: surgical anesthesia Staffing Performed: anesthesiologist  Anesthesiologist: Jacquelyne Matte, DO Performed by: Jacquelyne Matte, DO Authorized by: Jacquelyne Matte, DO   Preanesthetic Checklist Completed: patient identified, IV checked, risks and benefits discussed, surgical consent, monitors and equipment checked, pre-op evaluation and timeout performed Spinal Block Patient position: sitting Prep: DuraPrep and site prepped and draped Patient monitoring: cardiac monitor, continuous pulse ox and blood pressure Approach: midline Location: L3-4 Injection technique: single-shot Needle Needle type: Pencan  Needle gauge: 24 G Needle length: 9 cm Assessment Sensory level: T6 Events: CSF return Additional Notes Functioning IV was confirmed and monitors were applied. Sterile prep and drape, including hand hygiene and sterile gloves were used. The patient was positioned and the spine was prepped. The skin was anesthetized with lidocaine .  Free flow of clear CSF was obtained prior to injecting local anesthetic into the CSF.  The spinal needle aspirated freely following injection.  The needle was carefully withdrawn.  The patient tolerated the procedure well.

## 2023-10-14 NOTE — Discharge Instructions (Signed)

## 2023-10-14 NOTE — Anesthesia Postprocedure Evaluation (Signed)
 Anesthesia Post Note  Patient: Austin Webb  Procedure(s) Performed: TOTAL HIP REVISION (Right: Hip)     Patient location during evaluation: PACU Anesthesia Type: MAC and Spinal Level of consciousness: awake and alert, oriented and patient cooperative Pain management: pain level controlled Vital Signs Assessment: post-procedure vital signs reviewed and stable Respiratory status: spontaneous breathing, nonlabored ventilation and respiratory function stable Cardiovascular status: blood pressure returned to baseline and stable Postop Assessment: no apparent nausea or vomiting, no headache, no backache and spinal receding Anesthetic complications: no   No notable events documented.  Last Vitals:  Vitals:   10/14/23 1000 10/14/23 1026  BP: 117/71 125/67  Pulse: 63 63  Resp: 18 12  Temp:    SpO2: 100% 99%    Last Pain:  Vitals:   10/14/23 1026  TempSrc:   PainSc: 0-No pain    LLE Motor Response: Purposeful movement (10/14/23 1033) LLE Sensation: Full sensation (10/14/23 1033) RLE Motor Response: Purposeful movement (10/14/23 1033) RLE Sensation: Decreased (10/14/23 1033) L Sensory Level: S1-Sole of foot, small toes (10/14/23 1033) R Sensory Level: S1-Sole of foot, small toes (10/14/23 1033)  [address] [address] [place]

## 2023-10-14 NOTE — Interval H&P Note (Signed)
 History and Physical Interval Note:  10/14/2023 5:43 AM  [name]  has presented today for surgery, with the diagnosis of RIGHT HIP RECURRENT DISLOCATION.  The various methods of treatment have been discussed with the patient and family. After consideration of risks, benefits and other options for treatment, the patient has consented to  Procedure(s) with comments: TOTAL HIP REVISION (Right) - REVISION RIGHT TOTAL HIP ARTHROPLASTY as a surgical intervention.  The patient's history has been reviewed, patient examined, no change in status, stable for surgery.  I have reviewed the patient's chart and labs.  Questions were answered to the patient's satisfaction.     Austin Webb

## 2023-10-14 NOTE — Evaluation (Signed)
 Physical Therapy Evaluation Patient Details Name: Austin Webb MRN: [id number] DOB: Oct 31, 1940 Today's Date: 10/14/2023  History of Present Illness  83 y.o. male admitted 10/14/23 with Recurrent posterior dislocations right total hip, s/p THA revision. PMH: gout, HTN.   Clinical Impression  Pt admitted with above diagnosis. Pt ambulated 48' with RW, no loss of balance. Initiated THA HEP. Reviewed posterior precautions in detail with pt, spouse, and daughter.  Pt currently with functional limitations due to the deficits listed below (see PT Problem List). Pt will benefit from acute skilled PT to increase their independence and safety with mobility to allow discharge.           If plan is discharge home, recommend the following: A little help with walking and/or transfers;A little help with bathing/dressing/bathroom;Assistance with cooking/housework;Assist for transportation;Help with stairs or ramp for entrance   Can travel by private vehicle        Equipment Recommendations None recommended by PT  Recommendations for Other Services       Functional Status Assessment Patient has had a recent decline in their functional status and demonstrates the ability to make significant improvements in function in a reasonable and predictable amount of time.     Precautions / Restrictions Precautions Precautions: Posterior Hip Precaution Booklet Issued: Yes (comment) Recall of Precautions/Restrictions: Intact Precaution/Restrictions Comments: reviewed posterior hip precautions, handout issued Restrictions RLE Weight Bearing Per Provider Order: Weight bearing as tolerated      Mobility  Bed Mobility Overal bed mobility: Modified Independent             General bed mobility comments: HOB up, used rail, gait belt used as RLE lifter    Transfers Overall transfer level: Needs assistance Equipment used: Rolling walker (2 wheels) Transfers: Sit to/from Stand Sit to Stand: Contact  guard assist           General transfer comment: VCs hand placement    Ambulation/Gait Ambulation/Gait assistance: Contact guard assist Gait Distance (Feet): 70 Feet Assistive device: Rolling walker (2 wheels) Gait Pattern/deviations: Step-to pattern, Decreased step length - right, Decreased step length - left Gait velocity: decr     General Gait Details: steady with RW, no loss of balance  Stairs            Wheelchair Mobility     Tilt Bed    Modified Rankin (Stroke Patients Only)       Balance Overall balance assessment: Modified Independent                                           Pertinent Vitals/Pain Pain Assessment Pain Assessment: 0-10 Pain Score: 0-No pain Pain Intervention(s): Ice applied    Home Living Family/patient expects to be discharged to:: Private residence Living Arrangements: Spouse/significant other Available Help at Discharge: Family Type of Home: House Home Access: Stairs to enter Entrance Stairs-Rails: Right Entrance Stairs-Number of Steps: 3   Home Layout: One level Home Equipment: Agricultural consultant (2 wheels);Cane - single point;BSC/3in1;Shower seat      Prior Function Prior Level of Function : Independent/Modified Independent             Mobility Comments: walked without AD; ADLs Comments: independent     Extremity/Trunk Assessment   Upper Extremity Assessment Upper Extremity Assessment: Overall WFL for tasks assessed    Lower Extremity Assessment Lower Extremity Assessment: RLE deficits/detail RLE Deficits / Details:  knee ext at least 3/5, hip flexion AAROM ~45* RLE Sensation: WNL RLE Coordination: WNL    Cervical / Trunk Assessment Cervical / Trunk Assessment: Normal  Communication   Communication Communication: No apparent difficulties    Cognition Arousal: Alert Behavior During Therapy: WFL for tasks assessed/performed   PT - Cognitive impairments: No apparent impairments                          Following commands: Intact       Cueing Cueing Techniques: Verbal cues, Tactile cues     General Comments      Exercises Total Joint Exercises Ankle Circles/Pumps: AROM, Both, 10 reps Quad Sets: AROM, 5 reps, Right, Supine Heel Slides: AAROM, Right, 5 reps, Supine Hip ABduction/ADduction: AAROM, Right, 5 reps, Supine   Assessment/Plan    PT Assessment Patient needs continued PT services  PT Problem List Decreased knowledge of use of DME;Decreased balance;Decreased activity tolerance;Decreased mobility;Decreased knowledge of precautions       PT Treatment Interventions DME instruction;Gait training;Therapeutic exercise;Balance training;Patient/family education;Therapeutic activities    PT Goals (Current goals can be found in the Care Plan section)  Acute Rehab PT Goals Patient Stated Goal: mow the lawn PT Goal Formulation: With patient Time For Goal Achievement: 10/21/23 Potential to Achieve Goals: Good    Frequency 7X/week     Co-evaluation               AM-PAC PT "6 Clicks" Mobility  Outcome Measure Help needed turning from your back to your side while in a flat bed without using bedrails?: A Little Help needed moving from lying on your back to sitting on the side of a flat bed without using bedrails?: A Little Help needed moving to and from a bed to a chair (including a wheelchair)?: A Little Help needed standing up from a chair using your arms (e.g., wheelchair or bedside chair)?: A Little Help needed to walk in hospital room?: A Little Help needed climbing 3-5 steps with a railing? : A Lot 6 Click Score: 17    End of Session Equipment Utilized During Treatment: Gait belt Activity Tolerance: Patient tolerated treatment well Patient left: in chair;with chair alarm set;with call bell/phone within reach;with family/visitor present Nurse Communication: Mobility status PT Visit Diagnosis: Difficulty in walking, not elsewhere  classified (R26.2);Other abnormalities of gait and mobility (R26.89)    Time: 4540-9811 PT Time Calculation (min) (ACUTE ONLY): 44 min   Charges:   PT Evaluation $PT Eval Moderate Complexity: 1 Mod PT Treatments $Gait Training: 8-22 mins $Therapeutic Exercise: 8-22 mins PT General Charges $$ ACUTE PT VISIT: 1 Visit         Daymon Evans PT 10/14/2023  Acute Rehabilitation Services  Office [phone]

## 2023-10-14 NOTE — Transfer of Care (Signed)
 Immediate Anesthesia Transfer of Care Note  Patient: Austin Webb  Procedure(s) Performed: TOTAL HIP REVISION (Right: Hip)  Patient Location: PACU  Anesthesia Type:MAC and Spinal  Level of Consciousness: sedated  Airway & Oxygen Therapy: Patient Spontanous Breathing  Post-op Assessment: Report given to RN  Post vital signs: Reviewed and stable  Last Vitals:  Vitals Value Taken Time  BP    Temp    Pulse    Resp    SpO2      Last Pain:  Vitals:   10/14/23 0634  TempSrc:   PainSc: 0-No pain         Complications: No notable events documented.

## 2023-10-15 ENCOUNTER — Encounter (HOSPITAL_COMMUNITY): Payer: Self-pay | Admitting: Orthopedic Surgery

## 2023-10-15 NOTE — Progress Notes (Signed)
 PATIENT ID: Austin Webb  MRN: [id number]  DOB/AGE:  12-27-1940 / 83 y.o.  1 Day Post-Op Procedure(s) (LRB): TOTAL HIP REVISION (Right)    PROGRESS NOTE Subjective: Patient is alert, oriented, no Nausea, no Vomiting, yes passing gas, . Taking PO well. Denies SOB, Chest or Calf Pain. Using Incentive Spirometer, PAS in place. Ambulate 49'  Patient reports pain as  1/10  .    Objective: Vital signs in last 24 hours: Vitals:   10/14/23 1230 10/14/23 2159 10/15/23 0142 10/15/23 0610  BP: 122/68 124/77 132/67 127/81  Pulse: 65 75 (!) 58 (!) 57  Resp: 12 16 17 17   Temp: 97.9 F (36.6 C) 98 F (36.7 C) 97.8 F (36.6 C) 97.7 F (36.5 C)  TempSrc: Oral Oral Oral Oral  SpO2: 99% 94% 92% 95%  Weight:          Intake/Output from previous day: I/O last 3 completed shifts: In: 3401.2 [P.O.:807; I.V.:2394.2; IV Piggyback:200] Out: 3575 [Urine:3475; Blood:100]   Intake/Output this shift: No intake/output data recorded.   LABORATORY DATA: No results for input(s): "WBC", "HGB", "HCT", "PLT", "NA", "K", "CL", "CO2", "BUN", "CREATININE", "GLUCOSE", "GLUCAP", "INR", "CALCIUM" in the last 72 hours.  Invalid input(s): "PT", "2"  Examination: Neurologically intact ABD soft Neurovascular intact Sensation intact distally Intact pulses distally Dorsiflexion/Plantar flexion intact Incision: dressing C/D/I No cellulitis present Compartment soft} XR AP&Lat of hip shows well placed\fixed THA  Assessment:   1 Day Post-Op Procedure(s) (LRB): TOTAL HIP REVISION (Right) ADDITIONAL DIAGNOSIS:  HTN Patient was admitted as inpatient, because he had an inpatient only procedure  Plan: PT/OT WBAT, THA  DVT Prophylaxis: SCDx72 hrs, ASA 81 mg BID x 2 weeks  DISCHARGE PLAN: Home, today if passes PT  DISCHARGE NEEDS: Walker and 3-in-1 comode seatPatient ID: Austin Webb, male   DOB: 04-28-1941, 83 y.o.   MRN: [id number]

## 2023-10-15 NOTE — Progress Notes (Signed)
 Physical Therapy Treatment Patient Details Name: LAD PLANE MRN: [id number] DOB: [birth year] Today's Date: 10/15/2023   History of Present Illness 83 y.o. male admitted 10/14/23 with Recurrent posterior dislocations right total hip, s/p THA revision.    PT Comments  Patient has met PT goals for Dc home. Practiced steps. Reviewed precautions for posterior THA.      If plan is discharge home, recommend the following: A little help with walking and/or transfers;A little help with bathing/dressing/bathroom;Assistance with cooking/housework;Assist for transportation;Help with stairs or ramp for entrance   Can travel by private vehicle        Equipment Recommendations  None recommended by PT    Recommendations for Other Services       Precautions / Restrictions Precautions Precautions: Posterior Hip Recall of Precautions/Restrictions: Intact Precaution/Restrictions Comments: reviewed posterior hip precautions, handout issued Restrictions RLE Weight Bearing Per Provider Order: Weight bearing as tolerated     Mobility  Bed Mobility               General bed mobility comments: HOB up, gait belt used as RLE lifter    Transfers Overall transfer level: Needs assistance Equipment used: Rolling walker (2 wheels) Transfers: Sit to/from Stand Sit to Stand: Contact guard assist           General transfer comment: VCs hand placement    Ambulation/Gait Ambulation/Gait assistance: Contact guard assist Gait Distance (Feet): 70 Feet Assistive device: Rolling walker (2 wheels) Gait Pattern/deviations: Step-to pattern, Decreased step length - right, Decreased step length - left       General Gait Details: steady with RW, no loss of balance   Stairs Stairs: Yes Stairs assistance: Contact guard assist Stair Management: One rail Left, With cane Number of Stairs: 2 General stair comments: cues for sequence   Wheelchair Mobility     Tilt Bed    Modified Rankin  (Stroke Patients Only)       Balance Overall balance assessment: No apparent balance deficits (not formally assessed)                                          Communication Communication Communication: No apparent difficulties  Cognition Arousal: Alert Behavior During Therapy: WFL for tasks assessed/performed   PT - Cognitive impairments: No apparent impairments                         Following commands: Intact      Cueing    Exercises      General Comments General comments (skin integrity, edema, etc.): with Use of RW      Pertinent Vitals/Pain Pain Assessment Pain Descriptors / Indicators: Tightness Pain Intervention(s): Monitored during session    Home Living                          Prior Function            PT Goals (current goals can now be found in the care plan section) Progress towards PT goals: Progressing toward goals    Frequency    7X/week      PT Plan      Co-evaluation              AM-PAC PT "6 Clicks" Mobility   Outcome Measure  Help needed turning from your back to your side  while in a flat bed without using bedrails?: A Little Help needed moving from lying on your back to sitting on the side of a flat bed without using bedrails?: A Little Help needed moving to and from a bed to a chair (including a wheelchair)?: A Little Help needed standing up from a chair using your arms (e.g., wheelchair or bedside chair)?: A Little Help needed to walk in hospital room?: A Little Help needed climbing 3-5 steps with a railing? : A Little 6 Click Score: 18    End of Session Equipment Utilized During Treatment: Gait belt Activity Tolerance: Patient tolerated treatment well Patient left: in chair;with call bell/phone within reach Nurse Communication: Mobility status PT Visit Diagnosis: Difficulty in walking, not elsewhere classified (R26.2);Other abnormalities of gait and mobility (R26.89)      Time: 1610-9604 PT Time Calculation (min) (ACUTE ONLY): 23 min  Charges:    $Gait Training: 23-37 mins PT General Charges $$ ACUTE PT VISIT: 1 Visit                     Abelina Hoes PT Acute Rehabilitation Services Office [phone] Weekend pager-[phone]    Dareen Ebbing 10/15/2023, 2:02 PM

## 2023-10-15 NOTE — Discharge Summary (Signed)
 Patient ID: Austin Webb MRN: [id number] DOB/AGE: [birth year] 83 y.o.  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) Active Problems:   Recurrent dislocation of right hip   Discharge Diagnoses:  Same  Past Medical History:  Diagnosis Date   Anxiety    Arthritis    Osteoarthritis-left hip, s/p arthroplasty knees, right hip   Cancer (HCC)    basal and squamous cell cancer lesion   Cancer (HCC) 06/04/2000   prostate   Depression    GERD (gastroesophageal reflux disease)    Gout    History of kidney stones    x1 passed on own   Hypertension    Neuromuscular disorder (HCC)    restless leg syndrome   Sleep apnea    CPAP    Surgeries: Procedure(s): TOTAL HIP REVISION on 10/14/2023   Consultants:   Discharged Condition: Improved  Hospital Course: Austin Webb is an 83 y.o. male who was admitted 10/14/2023 for operative treatment ofRecurrent dislocation of hip joint prosthesis (HCC). Patient has severe unremitting pain that affects sleep, daily activities, and work/hobbies. After pre-op clearance the patient was taken to the operating room on 10/14/2023 and underwent  Procedure(s): TOTAL HIP REVISION.    Patient was given perioperative antibiotics:  Anti-infectives (From admission, onward)    Start     Dose/Rate Route Frequency Ordered Stop   10/14/23 0600  ceFAZolin  (ANCEF ) IVPB 2g/100 mL premix        2 g 200 mL/hr over 30 Minutes Intravenous On call to O.R. 10/14/23 2841 10/14/23 3244        Patient was given sequential compression devices, early ambulation, and chemoprophylaxis to prevent DVT.  Patient benefited maximally from hospital stay and there were no complications.    Recent vital signs: Patient Vitals for the past 24 hrs:  BP Temp Temp src Pulse Resp SpO2  10/15/23 0829 (!) 147/72 98 F (36.7 C) -- 60 16 97 %  10/15/23 0610 127/81 97.7 F (36.5  C) Oral (!) 57 17 95 %  10/15/23 0142 132/67 97.8 F (36.6 C) Oral (!) 58 17 92 %  10/14/23 2159 124/77 98 F (36.7 C) Oral 75 16 94 %     Recent laboratory studies: No results for input(s): "WBC", "HGB", "HCT", "PLT", "NA", "K", "CL", "CO2", "BUN", "CREATININE", "GLUCOSE", "INR", "CALCIUM" in the last 72 hours.  Invalid input(s): "PT", "2"   Discharge Medications:   Allergies as of 10/15/2023       Reactions   Arthrotec [diclofenac-misoprostol] Diarrhea        Medication List     TAKE these medications    acetaminophen  650 MG CR tablet Commonly known as: TYLENOL  Take 650-1,300 mg by mouth every 8 (eight) hours as needed for pain.   allopurinol 100 MG tablet Commonly known as: ZYLOPRIM Take 100 mg by mouth in the morning.   ALPRAZolam 0.5 MG tablet Commonly known as: XANAX Take 0.5 mg by mouth at bedtime.   amLODipine  10 MG tablet Commonly known as: NORVASC  Take 10 mg by mouth in the morning.   aspirin  EC 81 MG tablet Take 1 tablet (81 mg total) by mouth 2 (two) times daily. What changed: when to take this   FISH OIL PO Take 1 capsule by mouth daily with lunch.   ipratropium 0.06 % nasal spray Commonly known as: ATROVENT Place 2 sprays into both nostrils in the morning.   multivitamin with  minerals Tabs tablet Take 1 tablet by mouth daily with lunch.   oxyCODONE -acetaminophen  5-325 MG tablet Commonly known as: PERCOCET/ROXICET Take 1 tablet by mouth every 4 (four) hours as needed for severe pain (pain score 7-10).   pantoprazole  40 MG tablet Commonly known as: PROTONIX  Take 40 mg by mouth daily before breakfast.   sertraline  100 MG tablet Commonly known as: ZOLOFT  Take 100 mg by mouth in the morning.   Systane 0.4-0.3 % Soln Generic drug: Polyethyl Glycol-Propyl Glycol Place 1-2 drops into both eyes 3 (three) times daily as needed (dry/irritated eyes.).   tiZANidine 2 MG tablet Commonly known as: ZANAFLEX Take 1 tablet (2 mg total) by mouth  every 6 (six) hours as needed for muscle spasms.               Durable Medical Equipment  (From admission, onward)           Start     Ordered   10/14/23 1031  DME Walker rolling  Once       Question:  Patient needs a walker to treat with the following condition  Answer:  Status post right hip replacement   10/14/23 1030   10/14/23 1031  DME 3 n 1  Once        10/14/23 1030              Discharge Care Instructions  (From admission, onward)           Start     Ordered   10/15/23 0000  Weight bearing as tolerated        10/15/23 1255            Diagnostic Studies: DG Pelvis Portable Result Date: 10/14/2023 CLINICAL DATA:  History of revision of total right hip arthroplasty. EXAM: PORTABLE PELVIS 1-2 VIEWS COMPARISON:  AP bilateral hips 05/22/2010, right hip intraoperative fluoroscopy 08/12/2023, right hip radiographs 08/12/2023, right hip radiographs 01/24/2022 chest FINDINGS: Note is made of total right hip arthroplasty dislocation on 01/24/2022 initial radiographs (followed by subsequent reduction on radiographs later that day) and on 08/12/2023 radiographs. The right acetabular cup and femoral prosthesis were normally located on frontal fluoroscopic view 08/12/2023. Right acetabular cup an femoral hardware again demonstrate normal alignment on the provided frontal view. There is new right hip intra-articular air consistent with recent procedure. Mild bilateral sacroiliac subchondral sclerosis. Mild superolateral left acetabular degenerative osteophytosis. Mild superior pubic symphysis joint space narrowing. Moderate atherosclerotic calcifications. IMPRESSION: Revision of total right hip arthroplasty. No evidence of hardware complication. Electronically Signed   By: Bertina Broccoli M.D.   On: 10/14/2023 12:13    Disposition: Discharge disposition: 01-Home or Self Care       Discharge Instructions     Call MD / Call 911   Complete by: As directed    If you  experience chest pain or shortness of breath, CALL 911 and be transported to the hospital emergency room.  If you develope a fever above 101 F, pus (white drainage) or increased drainage or redness at the wound, or calf pain, call your surgeon's office.   Constipation Prevention   Complete by: As directed    Drink plenty of fluids.  Prune juice may be helpful.  You may use a stool softener, such as Colace (over the counter) 100 mg twice a day.  Use MiraLax (over the counter) for constipation as needed.   Diet - low sodium heart healthy   Complete by: As directed    Driving  restrictions   Complete by: As directed    No driving for 2 weeks   Increase activity slowly as tolerated   Complete by: As directed    Patient may shower   Complete by: As directed    You may shower without a dressing once there is no drainage.  Do not wash over the wound.  If drainage remains, cover wound with plastic wrap and then shower.   Post-operative opioid taper instructions:   Complete by: As directed    POST-OPERATIVE OPIOID TAPER INSTRUCTIONS: It is important to wean off of your opioid medication as soon as possible. If you do not need pain medication after your surgery it is ok to stop day one. Opioids include: Codeine, Hydrocodone(Norco, Vicodin), Oxycodone (Percocet, oxycontin ) and hydromorphone  amongst others.  Long term and even short term use of opiods can cause: Increased pain response Dependence Constipation Depression Respiratory depression And more.  Withdrawal symptoms can include Flu like symptoms Nausea, vomiting And more Techniques to manage these symptoms Hydrate well Eat regular healthy meals Stay active Use relaxation techniques(deep breathing, meditating, yoga) Do Not substitute Alcohol  to help with tapering If you have been on opioids for less than two weeks and do not have pain than it is ok to stop all together.  Plan to wean off of opioids This plan should start within one  week post op of your joint replacement. Maintain the same interval or time between taking each dose and first decrease the dose.  Cut the total daily intake of opioids by one tablet each day Next start to increase the time between doses. The last dose that should be eliminated is the evening dose.      Weight bearing as tolerated   Complete by: As directed         Follow-up Information     Wendolyn Hamburger, MD Follow up in 2 week(s).   Specialty: Orthopedic Surgery Contact information: [address] [place] [address] [phone]                  Signed: Gibson Kurtz 10/15/2023, 12:55 PM

## 2023-10-15 NOTE — Plan of Care (Signed)
 Problem: Education: Goal: Knowledge of General Education information will improve Description: Including pain rating scale, medication(s)/side effects and non-pharmacologic comfort measures 10/15/2023 1059 by Albertha Alosa, RN Outcome: Adequate for Discharge 10/15/2023 1045 by Albertha Alosa, RN Outcome: Adequate for Discharge   Problem: Health Behavior/Discharge Planning: Goal: Ability to manage health-related needs will improve 10/15/2023 1059 by Albertha Alosa, RN Outcome: Adequate for Discharge 10/15/2023 1045 by Albertha Alosa, RN Outcome: Adequate for Discharge   Problem: Clinical Measurements: Goal: Ability to maintain clinical measurements within normal limits will improve 10/15/2023 1059 by Albertha Alosa, RN Outcome: Adequate for Discharge 10/15/2023 1045 by Albertha Alosa, RN Outcome: Adequate for Discharge Goal: Will remain free from infection 10/15/2023 1059 by Albertha Alosa, RN Outcome: Adequate for Discharge 10/15/2023 1045 by Albertha Alosa, RN Outcome: Adequate for Discharge Goal: Diagnostic test results will improve 10/15/2023 1059 by Albertha Alosa, RN Outcome: Adequate for Discharge 10/15/2023 1045 by Albertha Alosa, RN Outcome: Adequate for Discharge Goal: Respiratory complications will improve 10/15/2023 1059 by Albertha Alosa, RN Outcome: Adequate for Discharge 10/15/2023 1045 by Albertha Alosa, RN Outcome: Adequate for Discharge Goal: Cardiovascular complication will be avoided 10/15/2023 1059 by Albertha Alosa, RN Outcome: Adequate for Discharge 10/15/2023 1045 by Albertha Alosa, RN Outcome: Adequate for Discharge   Problem: Activity: Goal: Risk for activity intolerance will decrease 10/15/2023 1059 by Albertha Alosa, RN Outcome: Adequate for Discharge 10/15/2023 1045 by Albertha Alosa, RN Outcome: Adequate for Discharge   Problem: Nutrition: Goal: Adequate nutrition will be maintained 10/15/2023 1059 by Albertha Alosa, RN Outcome:  Adequate for Discharge 10/15/2023 1045 by Albertha Alosa, RN Outcome: Adequate for Discharge   Problem: Coping: Goal: Level of anxiety will decrease 10/15/2023 1059 by Albertha Alosa, RN Outcome: Adequate for Discharge 10/15/2023 1045 by Albertha Alosa, RN Outcome: Adequate for Discharge   Problem: Elimination: Goal: Will not experience complications related to bowel motility 10/15/2023 1059 by Albertha Alosa, RN Outcome: Adequate for Discharge 10/15/2023 1045 by Albertha Alosa, RN Outcome: Adequate for Discharge Goal: Will not experience complications related to urinary retention 10/15/2023 1059 by Albertha Alosa, RN Outcome: Adequate for Discharge 10/15/2023 1045 by Albertha Alosa, RN Outcome: Adequate for Discharge   Problem: Pain Managment: Goal: General experience of comfort will improve and/or be controlled 10/15/2023 1059 by Albertha Alosa, RN Outcome: Adequate for Discharge 10/15/2023 1045 by Albertha Alosa, RN Outcome: Adequate for Discharge   Problem: Safety: Goal: Ability to remain free from injury will improve 10/15/2023 1059 by Albertha Alosa, RN Outcome: Adequate for Discharge 10/15/2023 1045 by Albertha Alosa, RN Outcome: Adequate for Discharge   Problem: Skin Integrity: Goal: Risk for impaired skin integrity will decrease 10/15/2023 1059 by Albertha Alosa, RN Outcome: Adequate for Discharge 10/15/2023 1045 by Albertha Alosa, RN Outcome: Adequate for Discharge   Problem: Education: Goal: Knowledge of the prescribed therapeutic regimen will improve 10/15/2023 1059 by Albertha Alosa, RN Outcome: Adequate for Discharge 10/15/2023 1045 by Albertha Alosa, RN Outcome: Adequate for Discharge Goal: Understanding of discharge needs will improve 10/15/2023 1059 by Albertha Alosa, RN Outcome: Adequate for Discharge 10/15/2023 1045 by Albertha Alosa, RN Outcome: Adequate for Discharge Goal: Individualized Educational Video(s) 10/15/2023 1059 by Albertha Alosa, RN Outcome: Adequate for Discharge 10/15/2023 1045 by Albertha Alosa, RN Outcome: Adequate for Discharge   Problem: Activity: Goal: Ability to avoid complications of  mobility impairment will improve 10/15/2023 1059 by Albertha Alosa, RN Outcome: Adequate for Discharge 10/15/2023 1045 by Albertha Alosa, RN Outcome: Adequate for Discharge Goal: Ability to tolerate increased activity will improve 10/15/2023 1059 by Albertha Alosa, RN Outcome: Adequate for Discharge 10/15/2023 1045 by Albertha Alosa, RN Outcome: Adequate for Discharge   Problem: Clinical Measurements: Goal: Postoperative complications will be avoided or minimized 10/15/2023 1059 by Albertha Alosa, RN Outcome: Adequate for Discharge 10/15/2023 1045 by Albertha Alosa, RN Outcome: Adequate for Discharge   Problem: Pain Management: Goal: Pain level will decrease with appropriate interventions 10/15/2023 1059 by Albertha Alosa, RN Outcome: Adequate for Discharge 10/15/2023 1045 by Albertha Alosa, RN Outcome: Adequate for Discharge   Problem: Skin Integrity: Goal: Will show signs of wound healing 10/15/2023 1059 by Albertha Alosa, RN Outcome: Adequate for Discharge 10/15/2023 1045 by Albertha Alosa, RN Outcome: Adequate for Discharge

## 2023-10-15 NOTE — Plan of Care (Signed)
  Problem: Education: Goal: Knowledge of General Education information will improve Description: Including pain rating scale, medication(s)/side effects and non-pharmacologic comfort measures Outcome: Adequate for Discharge   Problem: Health Behavior/Discharge Planning: Goal: Ability to manage health-related needs will improve Outcome: Adequate for Discharge   Problem: Clinical Measurements: Goal: Ability to maintain clinical measurements within normal limits will improve Outcome: Adequate for Discharge Goal: Will remain free from infection Outcome: Adequate for Discharge Goal: Diagnostic test results will improve Outcome: Adequate for Discharge Goal: Respiratory complications will improve Outcome: Adequate for Discharge Goal: Cardiovascular complication will be avoided Outcome: Adequate for Discharge   Problem: Activity: Goal: Risk for activity intolerance will decrease Outcome: Adequate for Discharge   Problem: Nutrition: Goal: Adequate nutrition will be maintained Outcome: Adequate for Discharge   Problem: Coping: Goal: Level of anxiety will decrease Outcome: Adequate for Discharge   Problem: Elimination: Goal: Will not experience complications related to bowel motility Outcome: Adequate for Discharge Goal: Will not experience complications related to urinary retention Outcome: Adequate for Discharge   Problem: Pain Managment: Goal: General experience of comfort will improve and/or be controlled Outcome: Adequate for Discharge   Problem: Safety: Goal: Ability to remain free from injury will improve Outcome: Adequate for Discharge   Problem: Skin Integrity: Goal: Risk for impaired skin integrity will decrease Outcome: Adequate for Discharge   Problem: Education: Goal: Knowledge of the prescribed therapeutic regimen will improve Outcome: Adequate for Discharge Goal: Understanding of discharge needs will improve Outcome: Adequate for Discharge Goal:  Individualized Educational Video(s) Outcome: Adequate for Discharge   Problem: Activity: Goal: Ability to avoid complications of mobility impairment will improve Outcome: Adequate for Discharge Goal: Ability to tolerate increased activity will improve Outcome: Adequate for Discharge   Problem: Clinical Measurements: Goal: Postoperative complications will be avoided or minimized Outcome: Adequate for Discharge   Problem: Pain Management: Goal: Pain level will decrease with appropriate interventions Outcome: Adequate for Discharge   Problem: Skin Integrity: Goal: Will show signs of wound healing Outcome: Adequate for Discharge

## 2023-10-15 NOTE — TOC Transition Note (Signed)
 Transition of Care [facility]) - Discharge Note   Patient Details  Name: Austin Webb MRN: [id number] Date of Birth: [birth year]  Transition of Care [facility]) CM/SW Contact:  Bari Leys, RN Phone Number: 10/15/2023, 10:39 AM   Clinical Narrative:   H&P states dc therapy HH PT. NCM sent teams chat to Ortho PA, Gibson Kurtz, confirmed [facility] PT arranged with [facility], pt confirmed he has a RW, no home DME needs. No TOC needs.      Final next level of care: Home w Home Health Services     Patient Goals and CMS Choice Patient states their goals for this hospitalization and ongoing recovery are:: return home          Discharge Placement                       Discharge Plan and Services Additional resources added to the After Visit Summary for                                       Social Drivers of Health (SDOH) Interventions SDOH Screenings   Food Insecurity: No Food Insecurity (10/14/2023)  Housing: Low Risk  (10/14/2023)  Transportation Needs: No Transportation Needs (10/14/2023)  Utilities: Not At Risk (10/14/2023)  Social Connections: Moderately Integrated (10/14/2023)  Tobacco Use: Medium Risk (10/14/2023)     Readmission Risk Interventions    10/15/2023   10:39 AM  Readmission Risk Prevention Plan  Post Dischage Appt Complete  Medication Screening Complete  Transportation Screening Complete

## 2023-10-17 DIAGNOSIS — T84020D Dislocation of internal right hip prosthesis, subsequent encounter: Secondary | ICD-10-CM | POA: Diagnosis not present

## 2023-10-17 DIAGNOSIS — I10 Essential (primary) hypertension: Secondary | ICD-10-CM | POA: Diagnosis not present

## 2023-10-17 DIAGNOSIS — Z7982 Long term (current) use of aspirin: Secondary | ICD-10-CM | POA: Diagnosis not present

## 2023-10-24 DIAGNOSIS — T84020D Dislocation of internal right hip prosthesis, subsequent encounter: Secondary | ICD-10-CM | POA: Diagnosis not present

## 2023-10-30 DIAGNOSIS — R1310 Dysphagia, unspecified: Secondary | ICD-10-CM | POA: Diagnosis not present

## 2023-10-30 DIAGNOSIS — J300 Vasomotor rhinitis: Secondary | ICD-10-CM | POA: Diagnosis not present

## 2024-01-22 DIAGNOSIS — Z008 Encounter for other general examination: Secondary | ICD-10-CM | POA: Diagnosis not present

## 2024-01-23 DIAGNOSIS — H35373 Puckering of macula, bilateral: Secondary | ICD-10-CM | POA: Diagnosis not present

## 2024-01-23 DIAGNOSIS — M3501 Sicca syndrome with keratoconjunctivitis: Secondary | ICD-10-CM | POA: Diagnosis not present

## 2024-01-23 DIAGNOSIS — H43813 Vitreous degeneration, bilateral: Secondary | ICD-10-CM | POA: Diagnosis not present

## 2024-01-23 DIAGNOSIS — H35039 Hypertensive retinopathy, unspecified eye: Secondary | ICD-10-CM | POA: Diagnosis not present

## 2024-04-06 DIAGNOSIS — R0683 Snoring: Secondary | ICD-10-CM | POA: Diagnosis not present

## 2024-04-06 DIAGNOSIS — R351 Nocturia: Secondary | ICD-10-CM | POA: Diagnosis not present

## 2024-04-06 DIAGNOSIS — Z9079 Acquired absence of other genital organ(s): Secondary | ICD-10-CM | POA: Diagnosis not present

## 2024-04-06 DIAGNOSIS — Z87898 Personal history of other specified conditions: Secondary | ICD-10-CM | POA: Diagnosis not present

## 2024-04-06 DIAGNOSIS — G4733 Obstructive sleep apnea (adult) (pediatric): Secondary | ICD-10-CM | POA: Diagnosis not present

## 2024-04-15 DIAGNOSIS — D692 Other nonthrombocytopenic purpura: Secondary | ICD-10-CM | POA: Diagnosis not present

## 2024-04-15 DIAGNOSIS — D2261 Melanocytic nevi of right upper limb, including shoulder: Secondary | ICD-10-CM | POA: Diagnosis not present

## 2024-04-15 DIAGNOSIS — L570 Actinic keratosis: Secondary | ICD-10-CM | POA: Diagnosis not present

## 2024-04-15 DIAGNOSIS — D2262 Melanocytic nevi of left upper limb, including shoulder: Secondary | ICD-10-CM | POA: Diagnosis not present

## 2024-04-15 DIAGNOSIS — Z85828 Personal history of other malignant neoplasm of skin: Secondary | ICD-10-CM | POA: Diagnosis not present

## 2024-04-15 DIAGNOSIS — D225 Melanocytic nevi of trunk: Secondary | ICD-10-CM | POA: Diagnosis not present

## 2024-04-15 DIAGNOSIS — L218 Other seborrheic dermatitis: Secondary | ICD-10-CM | POA: Diagnosis not present

## 2024-04-15 DIAGNOSIS — L738 Other specified follicular disorders: Secondary | ICD-10-CM | POA: Diagnosis not present

## 2024-04-15 DIAGNOSIS — L821 Other seborrheic keratosis: Secondary | ICD-10-CM | POA: Diagnosis not present

## 2024-05-04 DIAGNOSIS — J300 Vasomotor rhinitis: Secondary | ICD-10-CM | POA: Diagnosis not present

## 2024-05-07 DIAGNOSIS — G4733 Obstructive sleep apnea (adult) (pediatric): Secondary | ICD-10-CM | POA: Diagnosis not present

## 2024-05-10 DIAGNOSIS — G4733 Obstructive sleep apnea (adult) (pediatric): Secondary | ICD-10-CM | POA: Diagnosis not present
